# Patient Record
Sex: Male | Born: 1940
Health system: Southern US, Community
[De-identification: ages and names within clinical notes are randomized; demographics above are authoritative.]

## PROBLEM LIST (undated history)

## (undated) DIAGNOSIS — I10 Essential (primary) hypertension: Secondary | ICD-10-CM

---

## 2007-11-15 ENCOUNTER — Ambulatory Visit (HOSPITAL_COMMUNITY): Admission: RE | Admit: 2007-11-15 | Discharge: 2007-11-15 | Payer: Self-pay | Admitting: Pulmonary Disease

## 2008-12-02 ENCOUNTER — Encounter: Admission: RE | Admit: 2008-12-02 | Discharge: 2008-12-02 | Payer: Self-pay | Admitting: Family Medicine

## 2009-05-03 ENCOUNTER — Inpatient Hospital Stay (HOSPITAL_COMMUNITY): Admission: EM | Admit: 2009-05-03 | Discharge: 2009-05-05 | Payer: Self-pay | Admitting: Emergency Medicine

## 2009-05-04 ENCOUNTER — Ambulatory Visit: Payer: Self-pay | Admitting: Surgery

## 2009-05-04 ENCOUNTER — Encounter (INDEPENDENT_AMBULATORY_CARE_PROVIDER_SITE_OTHER): Payer: Self-pay | Admitting: Internal Medicine

## 2009-05-05 ENCOUNTER — Encounter (INDEPENDENT_AMBULATORY_CARE_PROVIDER_SITE_OTHER): Payer: Self-pay | Admitting: Internal Medicine

## 2010-02-09 ENCOUNTER — Encounter: Admission: RE | Admit: 2010-02-09 | Discharge: 2010-02-09 | Payer: Self-pay | Admitting: Family Medicine

## 2010-08-15 ENCOUNTER — Encounter: Payer: Self-pay | Admitting: Family Medicine

## 2010-10-28 LAB — RAPID URINE DRUG SCREEN, HOSP PERFORMED
Amphetamines: NOT DETECTED
Barbiturates: NOT DETECTED
Cocaine: NOT DETECTED
Tetrahydrocannabinol: NOT DETECTED

## 2010-10-28 LAB — DIFFERENTIAL
Basophils Absolute: 0 10*3/uL (ref 0.0–0.1)
Basophils Relative: 1 % (ref 0–1)
Eosinophils Absolute: 0.2 10*3/uL (ref 0.0–0.7)
Eosinophils Absolute: 0.3 10*3/uL (ref 0.0–0.7)
Eosinophils Relative: 4 % (ref 0–5)
Lymphs Abs: 3.1 10*3/uL (ref 0.7–4.0)
Monocytes Relative: 6 % (ref 3–12)
Monocytes Relative: 7 % (ref 3–12)
Neutrophils Relative %: 46 % (ref 43–77)

## 2010-10-28 LAB — MAGNESIUM: Magnesium: 2.4 mg/dL (ref 1.5–2.5)

## 2010-10-28 LAB — BASIC METABOLIC PANEL
BUN: 12 mg/dL (ref 6–23)
BUN: 29 mg/dL — ABNORMAL HIGH (ref 6–23)
BUN: 36 mg/dL — ABNORMAL HIGH (ref 6–23)
CO2: 25 mEq/L (ref 19–32)
CO2: 26 mEq/L (ref 19–32)
CO2: 27 mEq/L (ref 19–32)
Calcium: 8.8 mg/dL (ref 8.4–10.5)
Calcium: 9.8 mg/dL (ref 8.4–10.5)
Chloride: 102 mEq/L (ref 96–112)
Chloride: 84 mEq/L — ABNORMAL LOW (ref 96–112)
Chloride: 98 mEq/L (ref 96–112)
Creatinine, Ser: 0.99 mg/dL (ref 0.4–1.5)
Creatinine, Ser: 1.35 mg/dL (ref 0.4–1.5)
GFR calc non Af Amer: 38 mL/min — ABNORMAL LOW (ref >60)
GFR calc non Af Amer: 52 mL/min — ABNORMAL LOW (ref >60)
Glucose, Bld: 382 mg/dL — ABNORMAL HIGH (ref 70–99)
Glucose, Bld: 564 mg/dL (ref 70–99)
Potassium: 3.8 mEq/L (ref 3.5–5.1)
Potassium: 3.8 mEq/L (ref 3.5–5.1)
Potassium: 3.9 mEq/L (ref 3.5–5.1)
Sodium: 127 mEq/L — ABNORMAL LOW (ref 135–145)

## 2010-10-28 LAB — CBC
HCT: 45.1 % (ref 39.0–52.0)
Hemoglobin: 16.2 g/dL (ref 13.0–17.0)
MCHC: 33.5 g/dL (ref 30.0–36.0)
MCV: 90.2 fL (ref 78.0–100.0)
Platelets: 178 10*3/uL (ref 150–400)
Platelets: 190 10*3/uL (ref 150–400)
RBC: 4.99 MIL/uL (ref 4.22–5.81)
RBC: 5.28 MIL/uL (ref 4.22–5.81)

## 2010-10-28 LAB — GLUCOSE, CAPILLARY
Glucose-Capillary: 318 mg/dL — ABNORMAL HIGH (ref 70–99)
Glucose-Capillary: 351 mg/dL — ABNORMAL HIGH (ref 70–99)
Glucose-Capillary: 429 mg/dL — ABNORMAL HIGH (ref 70–99)
Glucose-Capillary: 482 mg/dL — ABNORMAL HIGH (ref 70–99)

## 2010-10-28 LAB — PHOSPHORUS
Phosphorus: 2.5 mg/dL (ref 2.3–4.6)
Phosphorus: 2.9 mg/dL (ref 2.3–4.6)

## 2010-10-28 LAB — LIPID PANEL
Cholesterol: 196 mg/dL (ref 0–200)
HDL: 39 mg/dL — ABNORMAL LOW (ref >39)
LDL Cholesterol: UNDETERMINED mg/dL (ref 0–99)
Total CHOL/HDL Ratio: 5 RATIO
Triglycerides: 652 mg/dL — ABNORMAL HIGH (ref <150)

## 2010-10-28 LAB — COMPREHENSIVE METABOLIC PANEL
ALT: 26 U/L (ref 0–53)
AST: 21 U/L (ref 0–37)
Calcium: 9.1 mg/dL (ref 8.4–10.5)
GFR calc Af Amer: 53 mL/min — ABNORMAL LOW (ref >60)
Glucose, Bld: 407 mg/dL — ABNORMAL HIGH (ref 70–99)
Sodium: 131 mEq/L — ABNORMAL LOW (ref 135–145)
Total Protein: 7.2 g/dL (ref 6.0–8.3)

## 2010-10-28 LAB — URINALYSIS, ROUTINE W REFLEX MICROSCOPIC
Bilirubin Urine: NEGATIVE
Ketones, ur: 15 mg/dL — AB
Protein, ur: 30 mg/dL — AB

## 2010-10-28 LAB — SODIUM, URINE, RANDOM: Sodium, Ur: 33 mEq/L

## 2010-10-28 LAB — NA AND K (SODIUM & POTASSIUM), RAND UR: Potassium Urine: 17 mEq/L

## 2010-10-28 LAB — URINE MICROSCOPIC-ADD ON

## 2010-10-28 LAB — HEMOGLOBIN A1C: Hgb A1c MFr Bld: 11.5 % — ABNORMAL HIGH (ref 4.6–6.1)

## 2010-10-28 LAB — CREATININE, URINE, RANDOM: Creatinine, Urine: 91 mg/dL

## 2010-10-28 LAB — OSMOLALITY: Osmolality: 292 mOsm/kg (ref 275–300)

## 2010-10-28 LAB — OSMOLALITY, URINE: Osmolality, Ur: 585 mOsm/kg (ref 390–1090)

## 2010-10-28 LAB — URINE CULTURE
Colony Count: NO GROWTH
Culture: NO GROWTH

## 2010-10-28 LAB — CALCIUM, URINE, RANDOM: Calcium, Ur: 1 mg/dL

## 2010-10-28 LAB — TSH: TSH: 1.972 u[IU]/mL (ref 0.350–4.500)

## 2016-10-31 ENCOUNTER — Ambulatory Visit (INDEPENDENT_AMBULATORY_CARE_PROVIDER_SITE_OTHER): Payer: 59

## 2016-10-31 ENCOUNTER — Ambulatory Visit (INDEPENDENT_AMBULATORY_CARE_PROVIDER_SITE_OTHER): Payer: 59 | Admitting: Orthopedic Surgery

## 2016-10-31 DIAGNOSIS — M25512 Pain in left shoulder: Secondary | ICD-10-CM | POA: Diagnosis not present

## 2016-11-01 ENCOUNTER — Encounter (INDEPENDENT_AMBULATORY_CARE_PROVIDER_SITE_OTHER): Payer: Self-pay | Admitting: Orthopedic Surgery

## 2016-11-01 DIAGNOSIS — M25512 Pain in left shoulder: Secondary | ICD-10-CM | POA: Diagnosis not present

## 2016-11-01 MED ORDER — BUPIVACAINE HCL 0.5 % IJ SOLN
9.0000 mL | INTRAMUSCULAR | Status: AC | PRN
  Administered 2016-11-01: 9 mL via INTRA_ARTICULAR

## 2016-11-01 MED ORDER — LIDOCAINE HCL 1 % IJ SOLN
5.0000 mL | INTRAMUSCULAR | Status: AC | PRN
  Administered 2016-11-01: 5 mL

## 2016-11-01 MED ORDER — METHYLPREDNISOLONE ACETATE 40 MG/ML IJ SUSP
40.0000 mg | INTRAMUSCULAR | Status: AC | PRN
  Administered 2016-11-01: 40 mg via INTRA_ARTICULAR

## 2016-11-01 NOTE — Progress Notes (Signed)
Office Visit Note   Patient: Lucas Lin           Date of Birth: 1941/01/27           MRN: [id number] Visit Date: 10/31/2016 Requested by: No referring provider defined for this encounter. PCP: Lupe Carney, MD  Subjective: Chief Complaint  Patient presents with  . Left Shoulder - Pain    HPI: [name] is a 76 year old patient with left shoulder pain.  He usually gets right shoulder injections for rotator cuff arthropathy but now he reports left shoulder pain.  He is right-hand-dominant.  He works at [employer].  Pain has been worse over the last 3 months.  He is been fairly severe for the last year.  He describes pain with laying down and swells with certain motions.  He does have difficulty with overhead motions as expected.  The pain does not wake him from sleep at night.              ROS: All systems reviewed are negative as they relate to the chief complaint within the history of present illness.  Patient denies  fevers or chills.   Assessment & Plan: Visit Diagnoses:  1. Left shoulder pain, unspecified chronicity     Plan: Impression is left shoulder rotator cuff arthropathy.  Plan is injection performed today.  He's going to consider reverse shoulder replacement we would need to assess the feasibility of that with a preoperative thin cut CT scan.  It looks like he does have significant glenoid erosion more so on the left than the right.  Follow-Up Instructions: No Follow-up on file.   Orders:  Orders Placed This Encounter  Procedures  . XR Shoulder Left   No orders of the defined types were placed in this encounter.     Procedures: Large Joint Inj Date/Time: 11/01/2016 12:03 PM Performed by: Cammy Copa Authorized by: Cammy Copa   Consent Given by:  Patient Site marked: the procedure site was marked   Timeout: prior to procedure the correct patient, procedure, and site was verified   Indications:  Pain and diagnostic  evaluation Location:  Shoulder Site:  L glenohumeral Prep: patient was prepped and draped in usual sterile fashion   Needle Size:  18 G Needle Length:  1.5 inches Approach:  Posterior Ultrasound Guidance: No   Fluoroscopic Guidance: No   Arthrogram: No   Medications:  5 mL lidocaine 1 %; 9 mL bupivacaine 0.5 %; 40 mg methylPREDNISolone acetate 40 MG/ML Aspiration Attempted: No   Patient tolerance:  Patient tolerated the procedure well with no immediate complications     Clinical Data: No additional findings.  Objective: Vital Signs: There were no vitals taken for this visit.  Physical Exam:   Constitutional: Patient appears well-developed HEENT:  Head: Normocephalic Eyes:EOM are normal Neck: Normal range of motion Cardiovascular: Normal rate Pulmonary/chest: Effort normal Neurologic: Patient is alert Skin: Skin is warm Psychiatric: Patient has normal mood and affect    Ortho ExaOr to exam demonstrates good cervical spine range of motion.  Patient has restricted voluntary forward flexion and abduction to less than 90 with both shoulders.  Course grinding and crepitus is present with internal/external rotation of the shoulder.  Deltoid does fire.  No other masses lymph adenopathy or skin changes noted in the shoulder girdle region.m:   Specialty Comments:  No specialty comments available.  Imaging: Xr Shoulder Left  Result Date: 11/01/2016 Left shoulder AP outlet and axillary reviewed.  Significant  glenohumeral rotator cuff arthropathy is present.  2 retained pins from prior acromioclavicular stabilization are noted.  They're unchanged in appearance from their prior x-rays.  Glenoid erosion is present.  Superior migration of the humeral head is present.    PMFS History: There are no active problems to display for this patient.  No past medical history on file.  No family history on file.  No past surgical history on file. Social History   Occupational History   . Not on file.   Social History Main Topics  . Smoking status: Not on file  . Smokeless tobacco: Not on file  . Alcohol use Not on file  . Drug use: Unknown  . Sexual activity: Not on file

## 2016-11-28 DIAGNOSIS — I10 Essential (primary) hypertension: Secondary | ICD-10-CM | POA: Diagnosis not present

## 2016-11-28 DIAGNOSIS — E7800 Pure hypercholesterolemia, unspecified: Secondary | ICD-10-CM | POA: Diagnosis not present

## 2016-11-28 DIAGNOSIS — E119 Type 2 diabetes mellitus without complications: Secondary | ICD-10-CM | POA: Diagnosis not present

## 2016-12-13 DIAGNOSIS — L02232 Carbuncle of back [any part, except buttock]: Secondary | ICD-10-CM | POA: Diagnosis not present

## 2016-12-26 DIAGNOSIS — H40013 Open angle with borderline findings, low risk, bilateral: Secondary | ICD-10-CM | POA: Diagnosis not present

## 2016-12-26 DIAGNOSIS — H2513 Age-related nuclear cataract, bilateral: Secondary | ICD-10-CM | POA: Diagnosis not present

## 2016-12-26 DIAGNOSIS — H01021 Squamous blepharitis right upper eyelid: Secondary | ICD-10-CM | POA: Diagnosis not present

## 2017-01-11 ENCOUNTER — Encounter (INDEPENDENT_AMBULATORY_CARE_PROVIDER_SITE_OTHER): Payer: Self-pay | Admitting: Orthopedic Surgery

## 2017-01-11 ENCOUNTER — Ambulatory Visit (INDEPENDENT_AMBULATORY_CARE_PROVIDER_SITE_OTHER): Payer: 59 | Admitting: Orthopedic Surgery

## 2017-01-11 DIAGNOSIS — M25512 Pain in left shoulder: Secondary | ICD-10-CM | POA: Diagnosis not present

## 2017-01-11 DIAGNOSIS — G8929 Other chronic pain: Secondary | ICD-10-CM

## 2017-01-11 NOTE — Progress Notes (Signed)
Office Visit Note   Patient: Lucas Lin           Date of Birth: 09/22/1940           MRN: [id number] Visit Date: 01/11/2017 Requested by: Clovis RileyMitchell, L.August Saucerean, MD [address] [place], [place] [address] PCP: Clovis RileyMitchell, L.August Saucerean, MD  Subjective: Chief Complaint  Patient presents with  . Left Shoulder - Follow-up    HPI: Lucas Lin is a patient with known bilateral rotator cuff arthropathy worse in the left shoulder.  Her for 2 month follow-up on the left shoulder.  Injection for 9 2018.  He states he is doing much better with his left shoulder.  He did get significant relief from the injection.  She still has some soreness but feels like he is still improving.  He is still working.  He is using both arms and shoulders.  He takes [facility] arthritis pain pills.  He is however having some more back issues.  He's had good result from epidural steroid injections in the past with Dr. Alvester MorinNewton.  Denies any red flag symptoms regarding the back.              ROS: All systems reviewed are negative as they relate to the chief complaint within the history of present illness.  Patient denies  fevers or chills.   Assessment & Plan: Visit Diagnoses:  1. Chronic left shoulder pain     Plan: Impression is bilateral shoulder rotator cuff arthropathy nonoperative on the left due to absence of sufficient glenoid bone.  He's doing recently well with these injections and we will continue these into the near future.  In regards to his back and like to send and Dr. Cyndie ChimeNguyen for consideration of further injection treatment.  I'll see him back as needed  Follow-Up Instructions: No Follow-up on file.   Orders:  Orders Placed This Encounter  Procedures  . Ambulatory referral to Physical Medicine Rehab   No orders of the defined types were placed in this encounter.     Procedures: No procedures performed   Clinical Data: No additional findings.  Objective: Vital Signs: There were no vitals taken  for this visit.  Physical Exam:   Constitutional: Patient appears well-developed HEENT:  Head: Normocephalic Eyes:EOM are normal Neck: Normal range of motion Cardiovascular: Normal rate Pulmonary/chest: Effort normal Neurologic: Patient is alert Skin: Skin is warm Psychiatric: Patient has normal mood and affect    Ortho Exam: Orthopedic exam demonstrates good cervical spine range of motion has coarseness and grinding with passive range of motion of both shoulders but it's worse on the left.  Does not have for flexor abduction above 90 and he does have rotator cuff weakness but the deltoid fires bilaterally.  Has some arthritis in his hands as well.  Radial pulses intact bilaterally.  No nerve retention signs in bilateral legs he'll pulses palpable no other masses lymph adenopathy or skin changes noted in the leg region.  He states he is listing a little bit to the right when he walks.  Specialty Comments:  No specialty comments available.  Imaging: No results found.   PMFS History: There are no active problems to display for this patient.  No past medical history on file.  No family history on file.  No past surgical history on file. Social History   Occupational History  . Not on file.   Social History Main Topics  . Smoking status: Former Games developermoker  . Smokeless tobacco: Never Used  .  Alcohol use Not on file  . Drug use: Unknown  . Sexual activity: Not on file

## 2017-01-31 ENCOUNTER — Ambulatory Visit (INDEPENDENT_AMBULATORY_CARE_PROVIDER_SITE_OTHER): Payer: Self-pay

## 2017-01-31 ENCOUNTER — Encounter (INDEPENDENT_AMBULATORY_CARE_PROVIDER_SITE_OTHER): Payer: Self-pay | Admitting: Physical Medicine and Rehabilitation

## 2017-01-31 ENCOUNTER — Ambulatory Visit (INDEPENDENT_AMBULATORY_CARE_PROVIDER_SITE_OTHER): Payer: 59 | Admitting: Physical Medicine and Rehabilitation

## 2017-01-31 VITALS — BP 138/71 | HR 67

## 2017-01-31 DIAGNOSIS — G8929 Other chronic pain: Secondary | ICD-10-CM | POA: Diagnosis not present

## 2017-01-31 DIAGNOSIS — M47816 Spondylosis without myelopathy or radiculopathy, lumbar region: Secondary | ICD-10-CM

## 2017-01-31 DIAGNOSIS — M545 Low back pain: Secondary | ICD-10-CM

## 2017-01-31 MED ORDER — METHYLPREDNISOLONE ACETATE 80 MG/ML IJ SUSP
80.0000 mg | Freq: Once | INTRAMUSCULAR | Status: AC
  Administered 2017-01-31: 80 mg

## 2017-01-31 MED ORDER — LIDOCAINE HCL (PF) 1 % IJ SOLN
2.0000 mL | Freq: Once | INTRAMUSCULAR | Status: AC
  Administered 2017-01-31: 2 mL

## 2017-01-31 NOTE — Patient Instructions (Signed)

## 2017-01-31 NOTE — Progress Notes (Deleted)
Patient states has a "nagging pain" in the middle of this low back. States back feels tired. No leg pain.

## 2017-02-02 NOTE — Procedures (Signed)
Lumbar Facet Joint Intra-Articular Injection(s) with Fluoroscopic Guidance  Patient: Lucas Lin      Date of Birth: 04/24/1941 MRN: [id number] PCP: Lucas Lin, L.Lucas Saucerean, MD      Visit Date: 01/31/2017   Lucas Lin is a 76 year old gentleman that I have seen on a few occasions and has completed epidural injections with really good relief of radicular hip and leg pain. He comes in today with slightly different complaints of midline low back pain at the lumbosacral junction. It's worse with standing and going from sit to stand. It is relieved at rest. He's had similar symptoms in the past but has had more radicular component. He's had no trauma or other problems just worsening low back pain. He states last injections helped his hip and leg significantly. He is followed by Dr. August Lin for his chronic shoulder pain as well and saw him recently. Imaging that we have has shown facet arthropathy and multilevel spurring at L5-S1. I think the best approach is diagnostic facet joint blocks at L5-S1. If that shows that it is facet mediated pain which I suspect that we could look at focused physical therapy followed by possibly double block paradigm medial branches and radiofrequency ablation. Physical exam shows patient relates without aid with good distal strength and pain with extension.  Universal Protocol:    Date/Time: 07/12/176:20 AM  Consent Given By: the patient  Position: PRONE   Additional Comments: Vital signs were monitored before and after the procedure. Patient was prepped and draped in the usual sterile fashion. The correct patient, procedure, and site was verified.   Injection Procedure Details:  Procedure Site One Meds Administered:  Meds ordered this encounter  Medications  . lidocaine (PF) (XYLOCAINE) 1 % injection 2 mL  . methylPREDNISolone acetate (DEPO-MEDROL) injection 80 mg     Laterality: Bilateral  Location/Site:  L5-S1  Needle size: 22 guage  Needle type:  Spinal  Needle Placement: Articular  Findings:  -Contrast Used: 0.5 mL iohexol 180 mg iodine/mL   -Comments: Excellent flow of contrast producing a partial arthrogram.  Procedure Details: The fluoroscope beam is vertically oriented in AP, and the inferior recess is visualized beneath the lower pole of the inferior apophyseal process, which represents the target point for needle insertion. When direct visualization is difficult the target point is located at the medial projection of the vertebral pedicle. The region overlying each aforementioned target is locally anesthetized with a 1 to 2 ml. volume of 1% Lidocaine without Epinephrine.   The spinal needle was inserted into each of the above mentioned facet joints using biplanar fluoroscopic guidance. A 0.25 to 0.5 ml. volume of Isovue-250 was injected and a partial facet joint arthrogram was obtained. A single spot film was obtained of the resulting arthrogram.    One to 1.25 ml of the steroid/anesthetic solution was then injected into each of the facet joints noted above.   Additional Comments:  The patient tolerated the procedure well Dressing: Band-Aid    Post-procedure details: Patient was observed during the procedure. Post-procedure instructions were reviewed.  Patient left the clinic in stable condition.

## 2017-05-04 ENCOUNTER — Telehealth (INDEPENDENT_AMBULATORY_CARE_PROVIDER_SITE_OTHER): Payer: Self-pay | Admitting: Physical Medicine and Rehabilitation

## 2017-05-04 NOTE — Telephone Encounter (Signed)
Patient said he got 60-70% relief for several weeks. Scheduled for 05/16/17 at 1500.

## 2017-05-04 NOTE — Telephone Encounter (Signed)
Ok only helped alot

## 2017-05-16 ENCOUNTER — Ambulatory Visit (INDEPENDENT_AMBULATORY_CARE_PROVIDER_SITE_OTHER): Payer: 59

## 2017-05-16 ENCOUNTER — Ambulatory Visit (INDEPENDENT_AMBULATORY_CARE_PROVIDER_SITE_OTHER): Payer: 59 | Admitting: Physical Medicine and Rehabilitation

## 2017-05-16 VITALS — BP 133/72 | HR 76 | Temp 97.5°F

## 2017-05-16 DIAGNOSIS — M47816 Spondylosis without myelopathy or radiculopathy, lumbar region: Secondary | ICD-10-CM

## 2017-05-16 DIAGNOSIS — G8929 Other chronic pain: Secondary | ICD-10-CM

## 2017-05-16 DIAGNOSIS — M545 Low back pain: Secondary | ICD-10-CM

## 2017-05-16 MED ORDER — METHYLPREDNISOLONE ACETATE 80 MG/ML IJ SUSP
80.0000 mg | Freq: Once | INTRAMUSCULAR | Status: AC
  Administered 2017-05-16: 80 mg

## 2017-05-16 MED ORDER — BUPIVACAINE HCL 0.5 % IJ SOLN
3.0000 mL | Freq: Once | INTRAMUSCULAR | Status: AC
  Administered 2017-05-16: 3 mL

## 2017-05-16 MED ORDER — LIDOCAINE HCL (PF) 1 % IJ SOLN
2.0000 mL | Freq: Once | INTRAMUSCULAR | Status: AC
  Administered 2017-05-16: 2 mL

## 2017-05-16 NOTE — Patient Instructions (Signed)

## 2017-05-16 NOTE — Progress Notes (Deleted)
Patient states he is having a "flare up". States the more he walks his back "draws up", like almost bad muscle spasms. Denies radiculopathy.

## 2017-05-22 ENCOUNTER — Telehealth (INDEPENDENT_AMBULATORY_CARE_PROVIDER_SITE_OTHER): Payer: Self-pay | Admitting: Physical Medicine and Rehabilitation

## 2017-05-22 NOTE — Telephone Encounter (Signed)
Called patient and advised him to call us when the pain is starting to come back so that we can refer him to physical therapy.

## 2017-05-22 NOTE — Telephone Encounter (Signed)
When he is starting to hurt we need to do PT

## 2017-05-26 NOTE — Procedures (Signed)
Mr. Lucas Lin is a very pleasant 76 year old who has axial low back pain chronic and worsening worsening with standing and extension.  He is failed conservative care including medication management modification and time.  This is been ongoing for quite a while.  Prior medial branch facet joint blocks were successful in relieving his pain actually lasted for quite a while.  And a repeat this today with a pain diary.  We would look at regrouping with physical therapy and then look at radiofrequency ablation.  The injection  will be diagnostic and hopefully therapeutic. The patient has failed conservative care including time, medications and activity modification.  Lumbar Diagnostic Facet Joint Nerve Block with Fluoroscopic Guidance   Patient: [name]      Date of Birth: 09/12/1940 MRN: [id number] PCP: Clovis RileyMitchell, L.August Saucerean, MD      Visit Date: 05/16/2017   Universal Protocol:    Date/Time: 11/02/185:31 AM  Consent Given By: the patient  Position: PRONE  Additional Comments: Vital signs were monitored before and after the procedure. Patient was prepped and draped in the usual sterile fashion. The correct patient, procedure, and site was verified.   Injection Procedure Details:  Procedure Site One Meds Administered:  Meds ordered this encounter  Medications  . lidocaine (PF) (XYLOCAINE) 1 % injection 2 mL  . methylPREDNISolone acetate (DEPO-MEDROL) injection 80 mg  . bupivacaine (MARCAINE) 0.5 % (with pres) injection 3 mL     Laterality: Bilateral  Location/Site:  L5-S1  Needle size: 22 G  Needle type: spinal needle  Needle Placement: Oblique pedical  Findings:  -Contrast Used: 0.5 mL iohexol 180 mg iodine/mL   -Comments: It was excellent flow of contrast along the articular pillars without intravascular flow.  Procedure Details: The fluoroscope beam is vertically oriented in AP and then obliqued 15 to 20 degrees to the ipsilateral side of the desired nerve to achieve  the "Scotty dog" appearance.  The skin over the target area of the junction of the superior articulating process and the transverse process (sacral ala if blocking the L5 dorsal rami) was locally anesthetized with a 1 ml volume of 1% Lidocaine without Epinephrine.  The spinal needle was inserted and advanced in a trajectory view down to the target.   After contact with periosteum and negative aspirate for blood and CSF, correct placement without intravascular or epidural spread was confirmed by injecting 0.5 ml. of Isovue-250.  A spot radiograph was obtained of this image.    Next, a 0.5 ml. volume of the injectate described above was injected. The needle was then redirected to the other facet joint nerves mentioned above if needed.  Prior to the procedure, the patient was given a Pain Diary which was completed for baseline measurements.  After the procedure, the patient rated their pain every 30 minutes and will continue rating at this frequency for a total of 5 hours.  The patient has been asked to complete the Diary and return to us by mail, fax or hand delivered as soon as possible.   Additional Comments:  The patient tolerated the procedure well Dressing: Band-Aid    Post-procedure details: Patient was observed during the procedure. Post-procedure instructions were reviewed.  Patient left the clinic in stable condition.

## 2017-06-01 DIAGNOSIS — E119 Type 2 diabetes mellitus without complications: Secondary | ICD-10-CM | POA: Diagnosis not present

## 2017-06-01 DIAGNOSIS — I10 Essential (primary) hypertension: Secondary | ICD-10-CM | POA: Diagnosis not present

## 2017-06-01 DIAGNOSIS — E7800 Pure hypercholesterolemia, unspecified: Secondary | ICD-10-CM | POA: Diagnosis not present

## 2017-10-23 DIAGNOSIS — R109 Unspecified abdominal pain: Secondary | ICD-10-CM | POA: Diagnosis not present

## 2017-11-28 DIAGNOSIS — H40013 Open angle with borderline findings, low risk, bilateral: Secondary | ICD-10-CM | POA: Diagnosis not present

## 2017-11-28 DIAGNOSIS — H2513 Age-related nuclear cataract, bilateral: Secondary | ICD-10-CM | POA: Diagnosis not present

## 2017-11-28 DIAGNOSIS — E119 Type 2 diabetes mellitus without complications: Secondary | ICD-10-CM | POA: Diagnosis not present

## 2017-11-30 DIAGNOSIS — E1121 Type 2 diabetes mellitus with diabetic nephropathy: Secondary | ICD-10-CM | POA: Diagnosis not present

## 2017-11-30 DIAGNOSIS — I10 Essential (primary) hypertension: Secondary | ICD-10-CM | POA: Diagnosis not present

## 2017-11-30 DIAGNOSIS — E7800 Pure hypercholesterolemia, unspecified: Secondary | ICD-10-CM | POA: Diagnosis not present

## 2018-03-01 DIAGNOSIS — E119 Type 2 diabetes mellitus without complications: Secondary | ICD-10-CM | POA: Diagnosis not present

## 2018-03-01 DIAGNOSIS — H2513 Age-related nuclear cataract, bilateral: Secondary | ICD-10-CM | POA: Diagnosis not present

## 2018-03-01 DIAGNOSIS — H40013 Open angle with borderline findings, low risk, bilateral: Secondary | ICD-10-CM | POA: Diagnosis not present

## 2018-04-17 DIAGNOSIS — H40013 Open angle with borderline findings, low risk, bilateral: Secondary | ICD-10-CM | POA: Diagnosis not present

## 2018-04-17 DIAGNOSIS — H2513 Age-related nuclear cataract, bilateral: Secondary | ICD-10-CM | POA: Diagnosis not present

## 2018-04-17 DIAGNOSIS — E119 Type 2 diabetes mellitus without complications: Secondary | ICD-10-CM | POA: Diagnosis not present

## 2018-06-05 DIAGNOSIS — Z23 Encounter for immunization: Secondary | ICD-10-CM | POA: Diagnosis not present

## 2018-06-05 DIAGNOSIS — E7800 Pure hypercholesterolemia, unspecified: Secondary | ICD-10-CM | POA: Diagnosis not present

## 2018-06-05 DIAGNOSIS — I10 Essential (primary) hypertension: Secondary | ICD-10-CM | POA: Diagnosis not present

## 2018-06-05 DIAGNOSIS — E1121 Type 2 diabetes mellitus with diabetic nephropathy: Secondary | ICD-10-CM | POA: Diagnosis not present

## 2019-05-24 ENCOUNTER — Emergency Department (HOSPITAL_COMMUNITY): Payer: No Typology Code available for payment source

## 2019-05-24 ENCOUNTER — Other Ambulatory Visit: Payer: Self-pay

## 2019-05-24 ENCOUNTER — Emergency Department (HOSPITAL_COMMUNITY)
Admission: EM | Admit: 2019-05-24 | Discharge: 2019-05-24 | Disposition: A | Discharge status: Home / Self Care | Payer: No Typology Code available for payment source | Attending: Emergency Medicine | Admitting: Emergency Medicine

## 2019-05-24 DIAGNOSIS — Z87891 Personal history of nicotine dependence: Secondary | ICD-10-CM | POA: Insufficient documentation

## 2019-05-24 DIAGNOSIS — W2210XA Striking against or struck by unspecified automobile airbag, initial encounter: Secondary | ICD-10-CM | POA: Insufficient documentation

## 2019-05-24 DIAGNOSIS — Y9389 Activity, other specified: Secondary | ICD-10-CM | POA: Diagnosis not present

## 2019-05-24 DIAGNOSIS — Y92410 Unspecified street and highway as the place of occurrence of the external cause: Secondary | ICD-10-CM | POA: Diagnosis not present

## 2019-05-24 DIAGNOSIS — Y998 Other external cause status: Secondary | ICD-10-CM | POA: Diagnosis not present

## 2019-05-24 DIAGNOSIS — S14109A Unspecified injury at unspecified level of cervical spinal cord, initial encounter: Secondary | ICD-10-CM | POA: Diagnosis present

## 2019-05-24 DIAGNOSIS — Z79899 Other long term (current) drug therapy: Secondary | ICD-10-CM | POA: Diagnosis not present

## 2019-05-24 DIAGNOSIS — S12601A Unspecified nondisplaced fracture of seventh cervical vertebra, initial encounter for closed fracture: Secondary | ICD-10-CM | POA: Diagnosis not present

## 2019-05-24 MED ORDER — OXYCODONE-ACETAMINOPHEN 5-325 MG PO TABS: 1.0000 | ORAL_TABLET | ORAL | 0 refills | Status: DC | PRN
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
## 2019-05-24 NOTE — Discharge Instructions (Addendum)
Please wear the Aspen collar at all times as instructed.  May remove to wash and change out the pads.  Recommend calling the neurosurgery office Monday morning to schedule a follow-up appointment.  If you develop numbness, weakness, difficulty breathing, chest pain, abdominal pain or other new concerning symptoms please return to ER for reassessment.  Recommend taking the pain medicine as prescribed, please note it can make you drowsy and should not be taken while operating machinery or driving.
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
## 2019-09-26 ENCOUNTER — Ambulatory Visit: Payer: 59 | Attending: Internal Medicine

## 2019-09-26 DIAGNOSIS — Z23 Encounter for immunization: Secondary | ICD-10-CM | POA: Insufficient documentation

## 2019-09-26 NOTE — Progress Notes (Signed)
   Covid-19 Vaccination Clinic  Name:  Lucas Lin    MRN: [id number] DOB: 1941/03/05  09/26/2019  Mr. Lucas Lin was observed post Covid-19 immunization for 15 minutes without incident. He was provided with Vaccine Information Sheet and instruction to access the V-Safe system.   Mr. Lucas Lin was instructed to call 911 with any severe reactions post vaccine: Marland Kitchen Difficulty breathing  . Swelling of face and throat  . A fast heartbeat  . A bad rash all over body  . Dizziness and weakness   Immunizations Administered    Name Date Dose VIS Date Route   Pfizer COVID-19 Vaccine 09/26/2019 12:43 PM 0.3 mL 07/05/2019 Intramuscular   Manufacturer: [employer], [employer]   Lot: [id number]   NDC: [address]-4

## 2019-10-23 ENCOUNTER — Ambulatory Visit: Payer: 59 | Attending: Internal Medicine

## 2019-10-23 DIAGNOSIS — Z23 Encounter for immunization: Secondary | ICD-10-CM

## 2019-10-23 NOTE — Progress Notes (Signed)
   Covid-19 Vaccination Clinic  Name:  Lucas Lin    MRN: [id number] DOB: [birth year]  10/23/2019  Lucas Lin was observed post Covid-19 immunization for 15 minutes without incident. He was provided with Vaccine Information Sheet and instruction to access the V-Safe system.   Lucas Lin was instructed to call 911 with any severe reactions post vaccine: Marland Kitchen Difficulty breathing  . Swelling of face and throat  . A fast heartbeat  . A bad rash all over body  . Dizziness and weakness   Immunizations Administered    Name Date Dose VIS Date Route   Pfizer COVID-19 Vaccine 10/23/2019 12:11 PM 0.3 mL 07/05/2019 Intramuscular   Manufacturer: [employer], [employer]   Lot: [id number]   NDC: [address]-4

## 2020-07-08 ENCOUNTER — Ambulatory Visit: Payer: No Typology Code available for payment source | Attending: Internal Medicine

## 2020-07-08 ENCOUNTER — Other Ambulatory Visit (HOSPITAL_COMMUNITY): Payer: Self-pay | Admitting: Internal Medicine

## 2020-07-08 DIAGNOSIS — Z23 Encounter for immunization: Secondary | ICD-10-CM

## 2020-07-08 NOTE — Progress Notes (Signed)
   Covid-19 Vaccination Clinic  Name:  Lucas Lin    MRN: [id number] DOB: [birth year]  07/08/2020  [name] was observed post Covid-19 immunization for 15 minutes without incident. He was provided with Vaccine Information Sheet and instruction to access the V-Safe system.   [name] was instructed to call 911 with any severe reactions post vaccine: Marland Kitchen Difficulty breathing  . Swelling of face and throat  . A fast heartbeat  . A bad rash all over body  . Dizziness and weakness   Immunizations Administered    Name Date Dose VIS Date Route   Pfizer COVID-19 Vaccine 07/08/2020  9:21 AM 0.3 mL 05/13/2020 Intramuscular   Manufacturer: [employer], [employer]   Lot: [id number]   NDC: [address]-2
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
## 2022-11-26 ENCOUNTER — Emergency Department (HOSPITAL_COMMUNITY): Payer: 59

## 2022-11-26 ENCOUNTER — Inpatient Hospital Stay (HOSPITAL_COMMUNITY)
Admission: EM | Admit: 2022-11-26 | Discharge: 2022-12-01 | DRG: 100 | Disposition: A | Discharge status: Home / Self Care | Payer: 59 | Attending: Internal Medicine | Admitting: Internal Medicine

## 2022-11-26 DIAGNOSIS — G40901 Epilepsy, unspecified, not intractable, with status epilepticus: Secondary | ICD-10-CM | POA: Diagnosis present

## 2022-11-26 DIAGNOSIS — N179 Acute kidney failure, unspecified: Secondary | ICD-10-CM | POA: Diagnosis not present

## 2022-11-26 DIAGNOSIS — R471 Dysarthria and anarthria: Secondary | ICD-10-CM | POA: Diagnosis present

## 2022-11-26 DIAGNOSIS — E860 Dehydration: Secondary | ICD-10-CM | POA: Diagnosis present

## 2022-11-26 DIAGNOSIS — G9341 Metabolic encephalopathy: Secondary | ICD-10-CM | POA: Diagnosis present

## 2022-11-26 DIAGNOSIS — E1110 Type 2 diabetes mellitus with ketoacidosis without coma: Secondary | ICD-10-CM | POA: Diagnosis present

## 2022-11-26 DIAGNOSIS — I10 Essential (primary) hypertension: Secondary | ICD-10-CM | POA: Diagnosis present

## 2022-11-26 DIAGNOSIS — M159 Polyosteoarthritis, unspecified: Secondary | ICD-10-CM | POA: Diagnosis present

## 2022-11-26 DIAGNOSIS — E1100 Type 2 diabetes mellitus with hyperosmolarity without nonketotic hyperglycemic-hyperosmolar coma (NKHHC): Secondary | ICD-10-CM | POA: Diagnosis present

## 2022-11-26 DIAGNOSIS — Z888 Allergy status to other drugs, medicaments and biological substances status: Secondary | ICD-10-CM

## 2022-11-26 DIAGNOSIS — I7389 Other specified peripheral vascular diseases: Secondary | ICD-10-CM | POA: Diagnosis not present

## 2022-11-26 DIAGNOSIS — E785 Hyperlipidemia, unspecified: Secondary | ICD-10-CM | POA: Diagnosis present

## 2022-11-26 DIAGNOSIS — M109 Gout, unspecified: Secondary | ICD-10-CM | POA: Diagnosis present

## 2022-11-26 DIAGNOSIS — E861 Hypovolemia: Secondary | ICD-10-CM | POA: Diagnosis present

## 2022-11-26 DIAGNOSIS — Z794 Long term (current) use of insulin: Secondary | ICD-10-CM | POA: Diagnosis not present

## 2022-11-26 DIAGNOSIS — Z87891 Personal history of nicotine dependence: Secondary | ICD-10-CM

## 2022-11-26 DIAGNOSIS — E875 Hyperkalemia: Secondary | ICD-10-CM | POA: Diagnosis present

## 2022-11-26 DIAGNOSIS — M25472 Effusion, left ankle: Secondary | ICD-10-CM | POA: Diagnosis present

## 2022-11-26 DIAGNOSIS — G939 Disorder of brain, unspecified: Secondary | ICD-10-CM | POA: Diagnosis present

## 2022-11-26 DIAGNOSIS — M25511 Pain in right shoulder: Secondary | ICD-10-CM | POA: Diagnosis present

## 2022-11-26 DIAGNOSIS — M25772 Osteophyte, left ankle: Secondary | ICD-10-CM | POA: Diagnosis present

## 2022-11-26 HISTORY — DX: Essential (primary) hypertension: I10

## 2022-11-26 LAB — I-STAT VENOUS BLOOD GAS, ED
Acid-base deficit: 6 mmol/L — ABNORMAL HIGH (ref 0.0–2.0)
Bicarbonate: 19.7 mmol/L — ABNORMAL LOW (ref 20.0–28.0)
Calcium, Ion: 1.07 mmol/L — ABNORMAL LOW (ref 1.15–1.40)
HCT: 44 % (ref 39.0–52.0)
Hemoglobin: 15 g/dL (ref 13.0–17.0)
O2 Saturation: 98 %
Potassium: 5.8 mmol/L — ABNORMAL HIGH (ref 3.5–5.1)
Sodium: 120 mmol/L — ABNORMAL LOW (ref 135–145)
TCO2: 21 mmol/L — ABNORMAL LOW (ref 22–32)
pCO2, Ven: 39.4 mmHg — ABNORMAL LOW (ref 44–60)
pH, Ven: 7.308 (ref 7.25–7.43)
pO2, Ven: 115 mmHg — ABNORMAL HIGH (ref 32–45)

## 2022-11-26 LAB — I-STAT CHEM 8, ED
BUN: 76 mg/dL — ABNORMAL HIGH (ref 8–23)
Calcium, Ion: 1.07 mmol/L — ABNORMAL LOW (ref 1.15–1.40)
Chloride: 90 mmol/L — ABNORMAL LOW (ref 98–111)
Creatinine, Ser: 4.5 mg/dL — ABNORMAL HIGH (ref 0.61–1.24)
Glucose, Bld: >700 mg/dL (ref 70–99)
HCT: 43 % (ref 39.0–52.0)
Hemoglobin: 14.6 g/dL (ref 13.0–17.0)
Potassium: 5.8 mmol/L — ABNORMAL HIGH (ref 3.5–5.1)
Sodium: 121 mmol/L — ABNORMAL LOW (ref 135–145)
TCO2: 20 mmol/L — ABNORMAL LOW (ref 22–32)

## 2022-11-26 LAB — COMPREHENSIVE METABOLIC PANEL
ALT: 20 U/L (ref 0–44)
AST: 29 U/L (ref 15–41)
Albumin: 3.9 g/dL (ref 3.5–5.0)
Alkaline Phosphatase: 131 U/L — ABNORMAL HIGH (ref 38–126)
Anion gap: 20 — ABNORMAL HIGH (ref 5–15)
BUN: 79 mg/dL — ABNORMAL HIGH (ref 8–23)
CO2: 17 mmol/L — ABNORMAL LOW (ref 22–32)
Calcium: 9.3 mg/dL (ref 8.9–10.3)
Chloride: 82 mmol/L — ABNORMAL LOW (ref 98–111)
Creatinine, Ser: 4.24 mg/dL — ABNORMAL HIGH (ref 0.61–1.24)
GFR, Estimated: 13 mL/min — ABNORMAL LOW (ref >60)
Glucose, Bld: 1100 mg/dL (ref 70–99)
Potassium: 5.7 mmol/L — ABNORMAL HIGH (ref 3.5–5.1)
Sodium: 119 mmol/L — CL (ref 135–145)
Total Bilirubin: 1 mg/dL (ref 0.3–1.2)
Total Protein: 7.9 g/dL (ref 6.5–8.1)

## 2022-11-26 LAB — BASIC METABOLIC PANEL
Anion gap: 18 — ABNORMAL HIGH (ref 5–15)
BUN: 83 mg/dL — ABNORMAL HIGH (ref 8–23)
CO2: 17 mmol/L — ABNORMAL LOW (ref 22–32)
Calcium: 9 mg/dL (ref 8.9–10.3)
Chloride: 86 mmol/L — ABNORMAL LOW (ref 98–111)
Creatinine, Ser: 3.86 mg/dL — ABNORMAL HIGH (ref 0.61–1.24)
GFR, Estimated: 15 mL/min — ABNORMAL LOW (ref >60)
Glucose, Bld: 1036 mg/dL (ref 70–99)
Potassium: 5.4 mmol/L — ABNORMAL HIGH (ref 3.5–5.1)
Sodium: 121 mmol/L — ABNORMAL LOW (ref 135–145)

## 2022-11-26 LAB — BETA-HYDROXYBUTYRIC ACID: Beta-Hydroxybutyric Acid: 1.23 mmol/L — ABNORMAL HIGH (ref 0.05–0.27)

## 2022-11-26 LAB — URINALYSIS, ROUTINE W REFLEX MICROSCOPIC
Bacteria, UA: NONE SEEN
Bilirubin Urine: NEGATIVE
Glucose, UA: >=500 mg/dL — AB
Ketones, ur: NEGATIVE mg/dL
Leukocytes,Ua: NEGATIVE
Nitrite: NEGATIVE
Protein, ur: NEGATIVE mg/dL
Specific Gravity, Urine: 1.016 (ref 1.005–1.030)
pH: 5 (ref 5.0–8.0)

## 2022-11-26 LAB — RAPID URINE DRUG SCREEN, HOSP PERFORMED
Amphetamines: NOT DETECTED
Barbiturates: NOT DETECTED
Benzodiazepines: POSITIVE — AB
Cocaine: NOT DETECTED
Opiates: NOT DETECTED
Tetrahydrocannabinol: NOT DETECTED

## 2022-11-26 LAB — APTT: aPTT: 23 seconds — ABNORMAL LOW (ref 24–36)

## 2022-11-26 LAB — CBG MONITORING, ED
Glucose-Capillary: >600 mg/dL (ref 70–99)
Glucose-Capillary: >600 mg/dL (ref 70–99)
Glucose-Capillary: >600 mg/dL (ref 70–99)
Glucose-Capillary: >600 mg/dL (ref 70–99)
Glucose-Capillary: >600 mg/dL (ref 70–99)
Glucose-Capillary: >600 mg/dL (ref 70–99)

## 2022-11-26 LAB — CBC WITH DIFFERENTIAL/PLATELET
Abs Immature Granulocytes: 0.03 10*3/uL (ref 0.00–0.07)
Basophils Absolute: 0.1 10*3/uL (ref 0.0–0.1)
Basophils Relative: 1 %
Eosinophils Absolute: 0 10*3/uL (ref 0.0–0.5)
Eosinophils Relative: 0 %
HCT: 39.7 % (ref 39.0–52.0)
Hemoglobin: 13.7 g/dL (ref 13.0–17.0)
Immature Granulocytes: 0 %
Lymphocytes Relative: 9 %
Lymphs Abs: 0.7 10*3/uL (ref 0.7–4.0)
MCH: 29.1 pg (ref 26.0–34.0)
MCHC: 34.5 g/dL (ref 30.0–36.0)
MCV: 84.3 fL (ref 80.0–100.0)
Monocytes Absolute: 0.4 10*3/uL (ref 0.1–1.0)
Monocytes Relative: 4 %
Neutro Abs: 7.4 10*3/uL (ref 1.7–7.7)
Neutrophils Relative %: 86 %
Platelets: 226 10*3/uL (ref 150–400)
RBC: 4.71 MIL/uL (ref 4.22–5.81)
RDW: 13.9 % (ref 11.5–15.5)
WBC: 8.5 10*3/uL (ref 4.0–10.5)
nRBC: 0 % (ref 0.0–0.2)

## 2022-11-26 LAB — LIPASE, BLOOD: Lipase: 78 U/L — ABNORMAL HIGH (ref 11–51)

## 2022-11-26 LAB — PROTIME-INR
INR: 1.2 (ref 0.8–1.2)
Prothrombin Time: 14.9 seconds (ref 11.4–15.2)

## 2022-11-26 LAB — ETHANOL: Alcohol, Ethyl (B): <10 mg/dL (ref <10)

## 2022-11-26 LAB — GLUCOSE, CAPILLARY
Glucose-Capillary: >600 mg/dL (ref 70–99)
Glucose-Capillary: >600 mg/dL (ref 70–99)

## 2022-11-26 LAB — CK: Total CK: 619 U/L — ABNORMAL HIGH (ref 49–397)

## 2022-11-26 LAB — LACTIC ACID, PLASMA
Lactic Acid, Venous: 3.9 mmol/L (ref 0.5–1.9)
Lactic Acid, Venous: 2.3 mmol/L (ref 0.5–1.9)

## 2022-11-26 LAB — OSMOLALITY: Osmolality: 347 mOsm/kg (ref 275–295)

## 2022-11-26 LAB — MAGNESIUM: Magnesium: 2.7 mg/dL — ABNORMAL HIGH (ref 1.7–2.4)

## 2022-11-26 MED ORDER — HEPARIN SODIUM (PORCINE) 5000 UNIT/ML IJ SOLN
5000.0000 [IU] | Freq: Three times a day (TID) | INTRAMUSCULAR | Status: DC
  Administered 2022-11-27 – 2022-12-01 (×13): 5000 [IU] via SUBCUTANEOUS
  Filled 2022-11-26 (×13): qty 1

## 2022-11-26 MED ORDER — LEVETIRACETAM IN NACL 1000 MG/100ML IV SOLN
1000.0000 mg | Freq: Once | INTRAVENOUS | Status: AC
  Administered 2022-11-26: 1000 mg via INTRAVENOUS

## 2022-11-26 MED ORDER — LORAZEPAM 2 MG/ML IJ SOLN
2.0000 mg | Freq: Once | INTRAMUSCULAR | Status: AC
  Administered 2022-11-26: 2 mg via INTRAVENOUS

## 2022-11-26 MED ORDER — INSULIN REGULAR(HUMAN) IN NACL 100-0.9 UT/100ML-% IV SOLN
INTRAVENOUS | Status: DC
  Administered 2022-11-27: 11 [IU]/h via INTRAVENOUS
  Filled 2022-11-26: qty 100

## 2022-11-26 MED ORDER — INSULIN REGULAR(HUMAN) IN NACL 100-0.9 UT/100ML-% IV SOLN
INTRAVENOUS | Status: DC
  Administered 2022-11-26: 11 [IU]/h via INTRAVENOUS
  Filled 2022-11-26: qty 100

## 2022-11-26 MED ORDER — DEXTROSE 50 % IV SOLN: 0.0000 mL | INTRAVENOUS | Status: DC | PRN

## 2022-11-26 MED ORDER — ACETAMINOPHEN 325 MG PO TABS
650.0000 mg | ORAL_TABLET | Freq: Four times a day (QID) | ORAL | Status: DC | PRN
  Administered 2022-11-28 – 2022-11-29 (×3): 650 mg via ORAL
  Filled 2022-11-26 (×4): qty 2

## 2022-11-26 MED ORDER — LEVETIRACETAM IN NACL 500 MG/100ML IV SOLN: 500.0000 mg | INTRAVENOUS | Status: DC

## 2022-11-26 MED ORDER — DEXTROSE IN LACTATED RINGERS 5 % IV SOLN: INTRAVENOUS | Status: DC

## 2022-11-26 MED ORDER — LEVETIRACETAM 500 MG PO TABS
500.0000 mg | ORAL_TABLET | ORAL | Status: DC
  Administered 2022-11-27: 500 mg via ORAL
  Filled 2022-11-26: qty 1

## 2022-11-26 MED ORDER — LACTATED RINGERS IV SOLN: INTRAVENOUS | Status: DC

## 2022-11-26 MED ORDER — ACETAMINOPHEN 650 MG RE SUPP: 650.0000 mg | RECTAL | Status: DC | PRN

## 2022-11-26 MED ORDER — LACTATED RINGERS IV BOLUS
20.0000 mL/kg | Freq: Once | INTRAVENOUS | Status: AC
  Administered 2022-11-26: 1700 mL via INTRAVENOUS

## 2022-11-26 MED ORDER — LORAZEPAM 2 MG/ML IJ SOLN
INTRAMUSCULAR | Status: AC
  Filled 2022-11-26: qty 1

## 2022-11-26 NOTE — H&P (Addendum)
NAME:  Lucas Lin, MRN:  [id number], DOB:  02/23/1941, LOS: 0 ADMISSION DATE:  11/26/2022, CONSULTATION DATE:  5/4 REFERRING MD:  Dr. Jearld Fenton, CHIEF COMPLAINT:  seizure   History of Present Illness:  Patient is a 82 yo M w/ pertinent PMH DMT2, htn, hld presents to [facility] on 5/4 w/ seizures.   Patient has history of DMT2 but not on any medications. Per son states he was on insulin but quit taking about a year ago because sugars have improved. In 2010 patient was admitted w/ HHS w/ seizure activity. Patient was non compliant w/ home diabetes medications. Patient had RUE twitching.   On 5/4, patient was found down having tonic clonic seizure activity. LKN 2 hour before being found down. No hx of seizures. Patient has been complaining fo RUE twitching over the last few days that improves w/ rest. Upon EMS arrival, patient minimally interactive and had vomited. Patient given versed and transferred to [facility] ED. Upon arrival patient hemodynamically stable and afebrile. Loaded w/ keppra. Neuro consulted. CT head no acute abnormality. Taken to MRI and given ativan; results pending. EEG pending.  Na 119 and glucose 1,100. AG 20, co2 17. VBG 7.30, 39, 115, 19. UA without ketones. Patient started on insulin drip and given iv fluids. PCCM consulted for icu admission.  Pertinent ED labs: creat 4.24, LA 3.9, ck 619   Pertinent  Medical History  HTN HLD DMT2  Significant Hospital Events: Including procedures, antibiotic start and stop dates in addition to other pertinent events   5/4 seizure activity; EEG pending; HHS started on insulin and given iv fluids  Interim History / Subjective:  See above  Objective   Blood pressure 134/66, pulse 73, temperature (!) 97.3 F (36.3 C), temperature source Rectal, resp. rate (!) 21, height 6' (1.829 m), weight 85 kg, SpO2 100 %.        Intake/Output Summary (Last 24 hours) at 11/26/2022 2056 Last data filed at 11/26/2022 1903 Gross per 24 hour  Intake 100 ml   Output --  Net 100 ml   Filed Weights   11/26/22 1838  Weight: 85 kg    Examination: General:  ill appearing elderly male  HEENT: MM pink/dry; [place] in place Neuro: sedate after receiving versed and ativan; moves all extremities spontaneously; does not respond to voice but withdraws to pain; pupils 2 mm b/l sluggish CV: s1s2, RRR, no m/r/g PULM:  dim clear BS bilaterally; [place]; snoring respirations GI: soft, bsx4 active  Extremities: warm/dry, no edema  Skin: no rashes or lesions appreciated   Resolved Hospital Problem list     Assessment & Plan:  Seizure Acute encephalopathy: likely post ictal vs. HHS -CT head and MRI no acute abnormality -no recorded hx of alcohol abuse P: -EEG pending -neuro following; appreciate recs -loaded w/ keppra; further AEDs per neuro -prn ativan for seizure activity -seizure precautions in place -limit sedating meds -trend ck/la; iv fluid -UDS, ethanol pending -neuro protective measures -aspiration precautions; high risk for intubation w/ poor mental status  HHS Hx of DMT2: per son was on insulin before but taken off a year ago because he improved w/ diet change/exercise P: -Insulin per endotool -CBG monitoring -Aggressive IV fluid resuscitation -f/u osmolality and B-hydroxy -serial bmp -check A1c -check lipase  AKI Hyponatremia: 119 but correct to 133 given severe hyperglycemia P: -IV fluids -Trend BMP / urinary output -Replace electrolytes as indicated -Avoid nephrotoxic agents, ensure adequate renal perfusion  HTN HLD P: -hold home anti-hypertensive's/statin while  npo   Best Practice (right click and "Reselect all SmartList Selections" daily)   Diet/type: NPO DVT prophylaxis: prophylactic heparin  GI prophylaxis: N/A Lines: N/A Foley:  N/A Code Status:  full code Last date of multidisciplinary goals of care discussion [5/4 spoke w/ son at bedside; states no living will at home. Would want intubation and cpr if  needed.]  Labs   CBC: Recent Labs  Lab 11/26/22 1828 11/26/22 1841 11/26/22 1842  WBC 8.5  --   --   NEUTROABS 7.4  --   --   HGB 13.7 14.6 15.0  HCT 39.7 43.0 44.0  MCV 84.3  --   --   PLT 226  --   --     Basic Metabolic Panel: Recent Labs  Lab 11/26/22 1828 11/26/22 1841 11/26/22 1842  NA 119* 121* 120*  K 5.7* 5.8* 5.8*  CL 82* 90*  --   CO2 17*  --   --   GLUCOSE 1,100* >700*  --   BUN 79* 76*  --   CREATININE 4.24* 4.50*  --   CALCIUM 9.3  --   --   MG 2.7*  --   --    GFR: Estimated Creatinine Clearance: 13.9 mL/min (A) (by C-G formula based on SCr of 4.5 mg/dL (H)). Recent Labs  Lab 11/26/22 1828  WBC 8.5  LATICACIDVEN 3.9*    Liver Function Tests: Recent Labs  Lab 11/26/22 1828  AST 29  ALT 20  ALKPHOS 131*  BILITOT 1.0  PROT 7.9  ALBUMIN 3.9   No results for input(s): "LIPASE", "AMYLASE" in the last 168 hours. No results for input(s): "AMMONIA" in the last 168 hours.  ABG    Component Value Date/Time   HCO3 19.7 (L) 11/26/2022 1842   TCO2 21 (L) 11/26/2022 1842   ACIDBASEDEF 6.0 (H) 11/26/2022 1842   O2SAT 98 11/26/2022 1842     Coagulation Profile: Recent Labs  Lab 11/26/22 1828 11/26/22 1936  INR 1.1 1.2    Cardiac Enzymes: Recent Labs  Lab 11/26/22 1828  CKTOTAL 619*    HbA1C: Hgb A1c MFr Bld  Date/Time Value Ref Range Status  05/03/2009 03:30 PM (H) 4.6 - 6.1 % Final   11.5 (NOTE) The ADA recommends the following therapeutic goal for glycemic control related to Hgb A1c measurement: Goal of therapy: <6.5 Hgb A1c  Reference: American Diabetes Association: Clinical Practice Recommendations 2010, Diabetes Care, 2010, 33: (Suppl  1).    CBG: Recent Labs  Lab 11/26/22 1829 11/26/22 1934  GLUCAP >600* >600*    Review of Systems:   Patient is encephalopathic; therefore, history has been obtained from chart review.    Past Medical History:  He,  has no past medical history on file.   Surgical History:     Social History:   reports that he has quit smoking. He has never used smokeless tobacco.   Family History:  His family history is not on file.   Allergies Allergies  Allergen Reactions   Doxazosin     Other reaction(s): Cough (ALLERGY/intolerance)     Home Medications  Prior to Admission medications   Medication Sig Start Date End Date Taking? Authorizing Provider  amLODipine (NORVASC) 10 MG tablet Take 10 mg by mouth daily. 09/21/16   [provider]  hydrochlorothiazide (HYDRODIURIL) 25 MG tablet Take 25 mg by mouth daily. 09/21/16   [provider]  losartan (COZAAR) 50 MG tablet Take 50 mg by mouth daily. 09/21/16   [provider]  oxyCODONE-acetaminophen (PERCOCET) 5-325 MG tablet Take 1 tablet by mouth every 4 (four) hours as needed for severe pain. 05/24/19   Milagros Loll, MD  pravastatin (PRAVACHOL) 40 MG tablet Take 40 mg by mouth daily. 09/21/16   [provider]     Critical care time: 45 minutes    JD Daryel [facility] 11/26/2022, 8:56 PM  Please see [email or web address] for pager details.  From 7A-7P if no response, please call [phone]. After hours, please call ELink [phone].

## 2022-11-26 NOTE — ED Provider Notes (Signed)
Lucas Lin Provider Note   CSN: [id number] Arrival date & time: 11/26/22  1813     History  Chief Complaint  Patient presents with  . Seizures    Coming from home with seizures. Still actively seizing with lip smacking type movement.     Lucas Lin is a 82 y.o. male with h/o T2DM not on insulin, HTN, HLD who presents with seizures.   Pt found by family after an hour of not seeing him, seizing with tonic clonic movement. No history of seizures, or trauma. He is a diabetic with a bgl reading HIGH. EMS gave total of 10 mg IM versed and another 5 mg total of IV versed with patient arriving still actively seizing.  Further history could not be obtained d/t acuity of situation, patient unresponsive.  HPI     Home Medications Prior to Admission medications   Medication Sig Start Date End Date Taking? Authorizing Provider  amLODipine (NORVASC) 10 MG tablet Take 10 mg by mouth daily. 09/21/16   [provider]  hydrochlorothiazide (HYDRODIURIL) 25 MG tablet Take 25 mg by mouth daily. 09/21/16   [provider]  losartan (COZAAR) 50 MG tablet Take 50 mg by mouth daily. 09/21/16   [provider]  oxyCODONE-acetaminophen (PERCOCET) 5-325 MG tablet Take 1 tablet by mouth every 4 (four) hours as needed for severe pain. 05/24/19   Milagros Loll, MD  pravastatin (PRAVACHOL) 40 MG tablet Take 40 mg by mouth daily. 09/21/16   [provider]      Allergies    Doxazosin    Review of Systems   Review of Systems  Unable to perform ROS: Patient unresponsive   Physical Exam Updated Vital Signs BP 135/68 (BP Location: Right Arm)   Pulse 80   Temp 98.5 F (36.9 C) (Oral)   Resp 17   Ht 6' (1.829 m)   Wt 85 kg   SpO2 96%   BMI 25.41 kg/m  Physical Exam General: male lying in bed, unresponsive HEENT: PERRLA midrange, Sclera anicteric, trachea midline. Vomitus on the front of his shirt Cardiology:  RRR, no murmurs/rubs/gallops. BL radial and DP pulses equal bilaterally.  Resp: Normal respiratory rate and effort. CTAB, no wheezes, rhonchi, crackles.  Abd: Soft, non-distended. No rebound tenderness or guarding.  GU: Deferred. MSK: No peripheral edema or signs of trauma. Extremities without deformity.  Skin: warm, dry. No rashes or lesions. Neuro: Unresponsive to verbal stimuli. Doesn't follow commands Briefly opens eyes to noxious stimuli.  Withdraws all extremities to pain, cranial nerves grossly intact.   ED Results / Procedures / Treatments   Labs (all labs ordered are listed, but only abnormal results are displayed) Labs Reviewed - No data to display  EKG EKG Interpretation  Date/Time:  Saturday Nov 26 2022 18:21:06 EDT Ventricular Rate:  87 PR Interval:  208 QRS Duration: 95 QT Interval:  372 QTC Calculation: 448 R Axis:   21 Text Interpretation: Sinus rhythm Nonspecific T wave abnormality Confirmed by Cathren Laine ([id number]) on 11/27/2022 7:57:53 AM  Radiology CTH: 1. No acute intracranial hemorrhage or infarct. Stable senescent change.  Procedures .Critical Care  Performed by: Loetta Rough, MD Authorized by: Loetta Rough, MD   Critical care provider statement:    Critical care time (minutes):  45   Critical care was necessary to treat or prevent imminent or life-threatening deterioration of the following conditions:  CNS failure or compromise, endocrine crisis and dehydration  Critical care was time spent personally by me on the following activities:  Development of treatment plan with patient or surrogate, discussions with consultants, evaluation of patient's response to treatment, examination of patient, ordering and review of laboratory studies, ordering and review of radiographic studies, ordering and performing treatments and interventions, pulse oximetry, re-evaluation of patient's condition, review of old charts and obtaining history from patient or  surrogate   Care discussed with: admitting provider       Medications Ordered in ED Medications  levETIRAcetam (KEPPRA) IVPB 1000 mg/100 mL premix (has no administration in time range)    Followed by  levETIRAcetam (KEPPRA) IVPB 1000 mg/100 mL premix (has no administration in time range)    ED Course/ Medical Decision Making/ A&P                          Medical Decision Making Amount and/or Complexity of Data Reviewed Labs: ordered. Decision-making details documented in ED Course. Radiology: ordered.  Risk Decision regarding hospitalization.    This patient presents to the ED for concern of AMS, seizures new onset; this involves an extensive number of treatment options, and is a complaint that carries with it a high risk of complications and morbidity.  I considered the following differential and admission for this acute, potentially life threatening condition.   MDM:    On arrival patient is minimally responsive but does not appear to be actively seizing. He withdraws extremities to pain. Will give 2g IV keppra and obtain CTH/CTA H&N immediately to r/o ICH or LVO. Will also consult with neurology immediately. Patient's BGL read HIGH, high c/f DKA/HHS causing symptoms, so also will give bolus of LR and IV insulin. K reads 5.7, glucose 1,100. He is currently protecting his airway but will monitor very closely for need for intubation. Other causes of seizures include  head injury or TBI, hypoxic-ischemic injury, or underlying epilepsy, vascular malformation such as AVM, electrolyte abnormalities (hyponatremia/hypernatremia, hypomagnesemia, hypocalcemia), uremia, hyperthyroidism. No h/o EtOHism, no tremors/vitals sign abnormalities to suggest alcohol withdrawal. Neurology recommending STAT MRI to r/o stroke and EEG. Received another 2 mg IV ativan to facilitate MRI imaging.  Clinical Course as of 12/14/22 2324  Sat Nov 26, 2022  1842 Glucose-Capillary(!!): >600 [HN]  1954 Lactic  Acid, Venous(!!): 3.9 [HN]  1954 CK Total(!): 619 [HN]  1954 Sodium(!!): 119 [HN]  1954 Glucose(!!): 1,100 [HN]  1954 Creatinine(!): 4.24 [HN]  1954 Potassium(!): 5.7 [HN]  2027 MRI negative per Dr. Iver Nestle [HN]    Clinical Course User Index [HN] Loetta Rough, MD    Labs: I Ordered, and personally interpreted labs.  The pertinent results include:  those listed above  Imaging Studies ordered: I ordered imaging studies including CTH I independently visualized and interpreted imaging. I agree with the radiologist interpretation  Additional history obtained from chart review, EMS, later family at bedside, .    Cardiac Monitoring: .The patient was maintained on a cardiac monitor.  I personally viewed and interpreted the cardiac monitored which showed an underlying rhythm of: NSR  Reevaluation: After the interventions noted above, I reevaluated the patient and found that they have :improved  Social Determinants of Health: .PT lives at facility  Disposition:  Admit to ICU for status epilepticus, c/f HHS  Co morbidities that complicate the patient evaluation .No past medical history on file.   Medicines Meds ordered this encounter  Medications  . FOLLOWED BY Linked Order Group   . levETIRAcetam (KEPPRA)  IVPB 1000 mg/100 mL premix   . levETIRAcetam (KEPPRA) IVPB 1000 mg/100 mL premix    I have reviewed the patients home medicines and have made adjustments as needed  Problem List / ED Course: Problem List Items Addressed This Visit       Nervous and Auditory   * (Principal) Status epilepticus (HCC) - Primary   Relevant Orders   Ambulatory referral to Physical Therapy   Ambulatory referral to Neurology   Other Visit Diagnoses     Hyperosmolar hyperglycemic state (HHS) (HCC)       Relevant Medications   insulin glargine (LANTUS) 100 UNIT/ML Solostar Pen   insulin lispro (HUMALOG) 100 UNIT/ML KwikPen   Glucagon (GVOKE HYPOPEN 2-PACK) 1 MG/0.2ML SOAJ   Other Relevant  Orders   Diet Carb Modified   AKI (acute kidney injury) (HCC)       Hyperkalemia                        This note was created using dictation software, which may contain spelling or grammatical errors.    Loetta Rough, MD 12/16/22 1106

## 2022-11-26 NOTE — Progress Notes (Signed)
eLink Physician-Brief Progress Note Patient Name: Lucas Lin DOB: October 30, 1940 MRN: [id number]   Date of Service  11/26/2022  HPI/Events of Note  82 yr old male presented with seizures secondary to hyperglycemia.  Multiple additional metabolic abnormalities found.  Not intubated.  Treatment started for HHS.  Close observation of resp status.  Neurology has seen.  EEG monitoring ongoing.  eICU Interventions  Chart reviewed     Intervention Category Evaluation Type: New Patient Evaluation  Henry Russel, P 11/26/2022, 11:31 PM

## 2022-11-26 NOTE — Progress Notes (Signed)
STAT EEG complete - results pending. ? ?

## 2022-11-26 NOTE — Progress Notes (Signed)
LTM EEG hooked up and running - no initial skin breakdown - push button tested   

## 2022-11-26 NOTE — Consult Note (Signed)
Neurology Consultation  Reason for Consult: c/f seizure  Requesting Physician: Vivi Barrack  CC: Emesis, poor responsiveness  History is obtained from: Son at bedside, chart review  HPI: Lucas Lin is a 82 y.o. male with a PMHx significant for HTN, HLD, DM   He is active and fully independent at baseline, works Administrator, [employer]. Told his son he hadn't been to work since Tuesday (unclear if he last went Mon or Tues) due to "arthritis" in his shoulder causing it to twitch. He wanted his son's help driving him to an appointment he scheduled for this coming Monday to get it evaluated as it was affecting his ability to drive.   Son was with him today from 10:30 AM to 2:30 PM at which time patient was at his baseline other than intermittent RUE twitching which improved with rest, but was also noted to be intermittently weak. He left briefly, returned at 5 PM and EMS was activated as he found the patient had vomited and was minimally interactive.  Received 15 mg of Versed with EMS for concern for tonic clonic seizure activity.  Received an additional 2 mg Ativan with me for stat MRI   No other recent complaints other than trouble hearing for the past few days which he thought may be due to wax buildup  Unclear medication adherence at this time, patient typically manages his medications himself and son was only able to find some empty bottles, not currently full bottles, on his attempt to find his father's medications.   Per son Negative for any seizure risk factors including: Birth and development normal Febrile seizures in childhood Significant head trauma Intracranial surgeries Mengingitis/Encephalitis history Family history of seizures or developmental delay   LKW: 2:30 PM on 5/3 (waxing waning symptoms since Mon or Tues) Thrombolytic given?: No, MRI negative for stroke  Given substantial worsening and disabling symptoms today, MRI obtained piror to decision making  Delay to  recognition due to possible limb shaking TIA vs. seizure  Checklist of contraindications was reviewed and negative. Risks, benefits and alternatives were discussed  IA performed?: No, MRI negative for stroke Premorbid modified rankin scale: 0-1     0 - No symptoms.     1 - No significant disability. Able to carry out all usual activities, despite some symptoms.   ROS: Unable to obtain due to altered mental status.   No past medical history on file.  No family history on file. Negative for seizures per son  Social History:  reports that he has quit smoking. He has never used smokeless tobacco. No history on file for alcohol use and drug use.  Current Outpatient Medications  Medication Instructions   amLODipine (NORVASC) 10 mg, Oral, Daily   hydrochlorothiazide (HYDRODIURIL) 25 mg, Oral, Daily   losartan (COZAAR) 50 mg, Oral, Daily   oxyCODONE-acetaminophen (PERCOCET) 5-325 MG tablet 1 tablet, Oral, Every 4 hours PRN   pravastatin (PRAVACHOL) 40 mg, Oral, Daily  --> still pending reconciliation  Per 07/2022 office visit: Allergies Allergen (clinical drug ingredient) Drug/Non Drug Allergy documented on EMR Reaction Allergy Type Onset Date Status  atorvastatin Lipitor confusion Drug Allergy   Active  lisinopril Lisinopril angioedema lips Drug Allergy   Active  doxazosin Doxazosin ?cough Drug Allergy   Active   Medications Medication SIG (Take, Route, Frequency, Duration) Notes Start Date End Date Status  Contour Blood Glucose test strips as directed Check CBG's daily       Active  Furosemide 20 MG 1 tablet  Orally Once a day as needed for swelling       Active  Gemfibrozil 600 MG 1 tablet Orally Once a day       Active  Centrum Silver 1 tablet Orally Once a day       Active  Cialis 20 MG 1 tablet Orally Once a day as needed       Active  Rosuvastatin Calcium 10 MG 1 tablet by mouth Once a day       Active  amLODIPine Besylate 5 MG 1 tablet Orally Once a day       Active  Timolol  Maleate 0.5 % 1 drop into affected eye Ophthalmic Once a day   05/26/2022   Active  Aspirin 325 MG 1 tablet Orally Once a day       Active  Tylenol Arthritis Pain 650 MG 2 tablet as needed Orally every 6 hours as needed       Active  Kerendia 10 MG 1 tablet Orally Once a day       Active  Latanoprost 0.005 % 1 drop into affected eye in the evening Ophthalmic Once a day   05/26/2022   Active  Losartan Potassium-HCTZ 50-12.5 MG 1 tablet Orally Once a day for 30 day(s)   07/06/2022   Active     Exam: Current vital signs: BP (!) 109/54   Pulse 79   Temp (!) 97.3 F (36.3 C) (Rectal)   Resp 20   Ht 6' (1.829 m)   Wt 85 kg   SpO2 94%   BMI 25.41 kg/m  Vital signs in last 24 hours: Temp:  [97.3 F (36.3 C)] 97.3 F (36.3 C) (05/04 1837) Pulse Rate:  [79-84] 79 (05/04 1900) Resp:  [20-22] 20 (05/04 1900) BP: (104-109)/(54-56) 109/54 (05/04 1900) SpO2:  [94 %-100 %] 94 % (05/04 1900) Weight:  [85 kg] 85 kg (05/04 1838)   Physical Exam  Constitutional: Appears acute ill Psych: Minimally interactive  Eyes: No scleral injection HENT: No oropharyngeal obstruction. Edentulous, dentures removed and given to family for MRI MSK: no joint deformities.  Cardiovascular: Normal rate and regular rhythm. Perfusing extremities well Respiratory: Gasping breathing  GI: Soft.  No distension. There is no tenderness.  Skin: Warm dry and intact visible skin  Neuro: Mental Status: Patient is minimally interactive, not verbal, not following commands, briefly opens eyes to noxious stim Cranial Nerves: II:  Pupils are equal, round, and reactive to light.   III,IV, VI: EOMI without ptosis or diploplia. Intermittent roving eye movements.  V: Facial sensation is symmetric to light eyelash brush VII: Facial movement is minimal VIII: No clear response to voice X/XI: Cough intact  Sensory/Motor: Localizes LUE, plegic RUE, spontaneously moves bilateral lower extremities grossly  equally Cerebellar/Gait Unable to assess secondary to patient's mental status    I have reviewed labs in epic and the results pertinent to this consultation are:  Basic Metabolic Panel: Recent Labs  Lab 11/26/22 1828 11/26/22 1841 11/26/22 1842  NA 119* 121* 120*  K 5.7* 5.8* 5.8*  CL 82* 90*  --   CO2 17*  --   --   GLUCOSE 1,100* >700*  --   BUN 79* 76*  --   CREATININE 4.24* 4.50*  --   CALCIUM 9.3  --   --   MG 2.7*  --   --   Corrected sodium ~130  CBC: Recent Labs  Lab 11/26/22 1828 11/26/22 1841 11/26/22 1842  WBC 8.5  --   --  NEUTROABS 7.4  --   --   HGB 13.7 14.6 15.0  HCT 39.7 43.0 44.0  MCV 84.3  --   --   PLT 226  --   --     Coagulation Studies: Recent Labs    11/26/22 1828  LABPROT 14.5  INR 1.1      I have reviewed the images obtained:   EKG w/ c/f left atrial enlargement   Impression: Most likely focal seizure with secondary generalization in the setting of uncontrolled glucose;   Emergent recommendations: - MRI to rule out stroke / limb shaking TIA as an etiology of his symptoms  - 2 mg ativan to facilitate imaging  No major diffusion restriction on my read, and patient not tolerating study well with significant head movement despite Ativan; vessel imaging cancelled.   Radiology read pending   Additional Recommendations: - Endotool for glucose management initiated on my request  - Appreciate further management of medical comorbidities per ED / CCM - LTM EEG with regular leads - s/p 2 g Keppra at ~19:00 on 5/4 - Keppra 500 mg q24 hr. Will adjust as needed for renal function and seizure control:  Estimated Creatinine Clearance: 13.9 mL/min (A) (by C-G formula based on SCr of 4.5 mg/dL (H)).   CrCl 80 to 130 mL/minute/1.73 m2: 500 mg to 1.5 g every 12 hours.  CrCl 50 to <80 mL/minute/1.73 m2: 500 mg to 1 g every 12 hours.  CrCl 30 to <50 mL/minute/1.73 m2: 250 to 750 mg every 12 hours.  CrCl 15 to <30 mL/minute/1.73 m2: 250 to  500 mg every 12 hours.  CrCl <15 mL/minute/1.73 m2: 250 to 500 mg every 24 hours (expert opinion). - Neurology will follow along   Brooke Dare MD-PhD [facility] [phone] Available 7 PM to 7 AM, outside of these hours please call Neurologist on call as listed on Amion.    Total critical care time: 60 minutes   Critical care time was exclusive of separately billable procedures and treating other patients.   Critical care was necessary to treat or prevent imminent or life-threatening deterioration, emergent evaluation for status epilepticus versus acute left MCA syndrome with potential to treat with thrombolytic or thrombectomy   Critical care was time spent personally by me on the following activities: development of treatment plan with patient and/or surrogate as well as nursing, discussions with consultants/primary team, evaluation of patient's response to treatment, examination of patient, obtaining history from patient or surrogate, ordering and performing treatments and interventions, ordering and review of laboratory studies, ordering and review of radiographic studies, and re-evaluation of patient's condition as needed, as documented above.

## 2022-11-26 NOTE — ED Notes (Signed)
EEG at bedside.

## 2022-11-26 NOTE — ED Triage Notes (Signed)
Pt coming from home via EMS with seizures. Pt found by family after an hour of not seeing him, seizing with tonic clonic movement. No history of seizures, or trauma. He is a diabetic with a bgl reading HIGH.

## 2022-11-27 DIAGNOSIS — G40901 Epilepsy, unspecified, not intractable, with status epilepticus: Secondary | ICD-10-CM | POA: Diagnosis not present

## 2022-11-27 DIAGNOSIS — I7389 Other specified peripheral vascular diseases: Secondary | ICD-10-CM

## 2022-11-27 LAB — BASIC METABOLIC PANEL
Anion gap: 17 — ABNORMAL HIGH (ref 5–15)
Anion gap: 14 (ref 5–15)
Anion gap: 12 (ref 5–15)
Anion gap: 12 (ref 5–15)
BUN: 74 mg/dL — ABNORMAL HIGH (ref 8–23)
BUN: 70 mg/dL — ABNORMAL HIGH (ref 8–23)
BUN: 65 mg/dL — ABNORMAL HIGH (ref 8–23)
BUN: 63 mg/dL — ABNORMAL HIGH (ref 8–23)
CO2: 25 mmol/L (ref 22–32)
CO2: 24 mmol/L (ref 22–32)
CO2: 26 mmol/L (ref 22–32)
CO2: 25 mmol/L (ref 22–32)
Calcium: 9.8 mg/dL (ref 8.9–10.3)
Calcium: 9.7 mg/dL (ref 8.9–10.3)
Calcium: 9.8 mg/dL (ref 8.9–10.3)
Calcium: 9.5 mg/dL (ref 8.9–10.3)
Chloride: 89 mmol/L — ABNORMAL LOW (ref 98–111)
Chloride: 95 mmol/L — ABNORMAL LOW (ref 98–111)
Chloride: 98 mmol/L (ref 98–111)
Chloride: 97 mmol/L — ABNORMAL LOW (ref 98–111)
Creatinine, Ser: 3.54 mg/dL — ABNORMAL HIGH (ref 0.61–1.24)
Creatinine, Ser: 3.01 mg/dL — ABNORMAL HIGH (ref 0.61–1.24)
Creatinine, Ser: 2.8 mg/dL — ABNORMAL HIGH (ref 0.61–1.24)
Creatinine, Ser: 2.48 mg/dL — ABNORMAL HIGH (ref 0.61–1.24)
GFR, Estimated: 16 mL/min — ABNORMAL LOW (ref >60)
GFR, Estimated: 20 mL/min — ABNORMAL LOW (ref >60)
GFR, Estimated: 22 mL/min — ABNORMAL LOW (ref >60)
GFR, Estimated: 25 mL/min — ABNORMAL LOW (ref >60)
Glucose, Bld: 581 mg/dL (ref 70–99)
Glucose, Bld: 299 mg/dL — ABNORMAL HIGH (ref 70–99)
Glucose, Bld: 159 mg/dL — ABNORMAL HIGH (ref 70–99)
Glucose, Bld: 142 mg/dL — ABNORMAL HIGH (ref 70–99)
Potassium: 3.8 mmol/L (ref 3.5–5.1)
Potassium: 3.6 mmol/L (ref 3.5–5.1)
Potassium: 3.6 mmol/L (ref 3.5–5.1)
Potassium: 3.6 mmol/L (ref 3.5–5.1)
Sodium: 131 mmol/L — ABNORMAL LOW (ref 135–145)
Sodium: 133 mmol/L — ABNORMAL LOW (ref 135–145)
Sodium: 136 mmol/L (ref 135–145)
Sodium: 134 mmol/L — ABNORMAL LOW (ref 135–145)

## 2022-11-27 LAB — GLUCOSE, CAPILLARY
Glucose-Capillary: 145 mg/dL — ABNORMAL HIGH (ref 70–99)
Glucose-Capillary: 148 mg/dL — ABNORMAL HIGH (ref 70–99)
Glucose-Capillary: 150 mg/dL — ABNORMAL HIGH (ref 70–99)
Glucose-Capillary: 150 mg/dL — ABNORMAL HIGH (ref 70–99)
Glucose-Capillary: 152 mg/dL — ABNORMAL HIGH (ref 70–99)
Glucose-Capillary: 158 mg/dL — ABNORMAL HIGH (ref 70–99)
Glucose-Capillary: 171 mg/dL — ABNORMAL HIGH (ref 70–99)
Glucose-Capillary: 188 mg/dL — ABNORMAL HIGH (ref 70–99)
Glucose-Capillary: 227 mg/dL — ABNORMAL HIGH (ref 70–99)
Glucose-Capillary: 301 mg/dL — ABNORMAL HIGH (ref 70–99)
Glucose-Capillary: 393 mg/dL — ABNORMAL HIGH (ref 70–99)
Glucose-Capillary: 410 mg/dL — ABNORMAL HIGH (ref 70–99)
Glucose-Capillary: 441 mg/dL — ABNORMAL HIGH (ref 70–99)
Glucose-Capillary: 521 mg/dL (ref 70–99)
Glucose-Capillary: 542 mg/dL (ref 70–99)
Glucose-Capillary: 573 mg/dL (ref 70–99)
Glucose-Capillary: 590 mg/dL (ref 70–99)
Glucose-Capillary: >600 mg/dL (ref 70–99)
Glucose-Capillary: >600 mg/dL (ref 70–99)
Glucose-Capillary: >600 mg/dL (ref 70–99)

## 2022-11-27 LAB — LACTIC ACID, PLASMA: Lactic Acid, Venous: 2.7 mmol/L (ref 0.5–1.9)

## 2022-11-27 LAB — PHOSPHORUS: Phosphorus: 3.7 mg/dL (ref 2.5–4.6)

## 2022-11-27 LAB — CBC
HCT: 35.5 % — ABNORMAL LOW (ref 39.0–52.0)
Hemoglobin: 12.6 g/dL — ABNORMAL LOW (ref 13.0–17.0)
MCH: 29.3 pg (ref 26.0–34.0)
MCHC: 35.5 g/dL (ref 30.0–36.0)
MCV: 82.6 fL (ref 80.0–100.0)
Platelets: 223 10*3/uL (ref 150–400)
RBC: 4.3 MIL/uL (ref 4.22–5.81)
RDW: 13.9 % (ref 11.5–15.5)
WBC: 11.6 10*3/uL — ABNORMAL HIGH (ref 4.0–10.5)
nRBC: 0 % (ref 0.0–0.2)

## 2022-11-27 LAB — MAGNESIUM: Magnesium: 2.8 mg/dL — ABNORMAL HIGH (ref 1.7–2.4)

## 2022-11-27 LAB — MRSA NEXT GEN BY PCR, NASAL: MRSA by PCR Next Gen: NOT DETECTED

## 2022-11-27 MED ORDER — CHLORHEXIDINE GLUCONATE CLOTH 2 % EX PADS
6.0000 | MEDICATED_PAD | Freq: Every day | CUTANEOUS | Status: DC
  Administered 2022-11-27 – 2022-11-29 (×3): 6 via TOPICAL

## 2022-11-27 MED ORDER — INSULIN DETEMIR 100 UNIT/ML ~~LOC~~ SOLN
20.0000 [IU] | Freq: Two times a day (BID) | SUBCUTANEOUS | Status: DC
  Administered 2022-11-27 – 2022-11-29 (×5): 20 [IU] via SUBCUTANEOUS
  Filled 2022-11-27 (×7): qty 0.2

## 2022-11-27 MED ORDER — INSULIN ASPART 100 UNIT/ML IJ SOLN
1.0000 [IU] | INTRAMUSCULAR | Status: DC
  Administered 2022-11-27: 2 [IU] via SUBCUTANEOUS
  Administered 2022-11-27: 1 [IU] via SUBCUTANEOUS
  Administered 2022-11-28: 2 [IU] via SUBCUTANEOUS
  Administered 2022-11-28: 1 [IU] via SUBCUTANEOUS

## 2022-11-27 NOTE — Progress Notes (Signed)
NAME:  Lucas Lin, MRN:  [id number], DOB:  1940/11/13, LOS: 1 ADMISSION DATE:  11/26/2022, CONSULTATION DATE:  5/4 REFERRING MD:  Dr. Jearld Fenton, CHIEF COMPLAINT:  seizure   History of Present Illness:  Patient is a 82 yo M w/ pertinent PMH DMT2, htn, hld presents to [facility] on 5/4 w/ seizures.   Patient has history of DMT2 but not on any medications. Per son states he was on insulin but quit taking about a year ago because sugars have improved. In 2010 patient was admitted w/ HHS w/ seizure activity. Patient was non compliant w/ home diabetes medications. Patient had RUE twitching.   On 5/4, patient was found down having tonic clonic seizure activity. LKN 2 hour before being found down. No hx of seizures. Patient has been complaining fo RUE twitching over the last few days that improves w/ rest. Upon EMS arrival, patient minimally interactive and had vomited. Patient given versed and transferred to [facility] ED. Upon arrival patient hemodynamically stable and afebrile. Loaded w/ keppra. Neuro consulted. CT head no acute abnormality. Taken to MRI and given ativan; results pending. EEG pending.  Na 119 and glucose 1,100. AG 20, co2 17. VBG 7.30, 39, 115, 19. UA without ketones. Patient started on insulin drip and given iv fluids. PCCM consulted for icu admission.  Pertinent ED labs: creat 4.24, LA 3.9, ck 619   Pertinent  Medical History  HTN HLD DMT2  Significant Hospital Events: Including procedures, antibiotic start and stop dates in addition to other pertinent events   5/4 seizure activity; EEG pending; HHS started on insulin and given iv fluids  Interim History / Subjective:  Overnight EEG showed no active seizures Patient is awake, mental status is improved significantly Remain afebrile  Objective   Blood pressure 104/71, pulse 69, temperature 98 F (36.7 C), temperature source Axillary, resp. rate 13, height 6' (1.829 m), weight 85 kg, SpO2 100 %.        Intake/Output Summary (Last  24 hours) at 11/27/2022 1010 Last data filed at 11/27/2022 0900 Gross per 24 hour  Intake 3152.31 ml  Output 1750 ml  Net 1402.31 ml   Filed Weights   11/26/22 1838  Weight: 85 kg    Examination: Physical exam: General: Acute on chronically ill-appearing male, lying on the bed HEENT: [place]/AT, eyes anicteric.  Dry mucus membranes Neuro: Awake, oriented, moving all 4 extremities Chest: Coarse breath sounds, no wheezes or rhonchi Heart: Regular rate and rhythm, no murmurs or gallops Abdomen: Soft, nontender, nondistended, bowel sounds present Skin: No rash  Labs and images were reviewed  Resolved Hospital Problem list     Assessment & Plan:  Multiple seizures in the setting of HHS Acute metabolic encephalopathy in the setting of HHS and AKI CT head showed no acute abnormalities MRI brain showed left parietal calvarium lesion, likely metastatic disease EEG showed no active seizures Loaded with Keppra overnight Continue Keppra 500 mg daily Continue seizure precautions Avoid sedation  Hyperosmolar hyperglycemia syndrome Poorly controlled diabetes type 2 with hyperglycemia  Per son was on insulin before but taken off a year ago because he improved w/ diet change/exercise Hemoglobin A1c is pending He presented with blood sugar over 600 Still on insulin per Endo tool for HHS Will transition to long-acting insulin with Levemir 20 units twice daily Monitor fingerstick Continue IV fluid Monitor electrolytes  AKI in the setting of severe dehydration caused by HHS Pseudohyponatremia in the setting of hyperglycemia Patient presented with serum creatinine of 4.2, we  do not know baseline as he had labs done in 2010 with our system Serum creatinine continue to trend down Continue aggressive IV fluid Monitor intake and output Avoid nephrotoxic agents Corrected serum sodium is 135  Lactic acidosis, due to seizure and severe dehydration Lactate is trending down  HTN HLD Hold home  anti-hypertensive's/statin while npo  Left-sided skull lesion noted on MRI Needs outpatient workup for malignancy   Best Practice (right click and "Reselect all SmartList Selections" daily)   Diet/type: NPO SLP evaluation DVT prophylaxis: prophylactic heparin  GI prophylaxis: N/A Lines: N/A Foley:  N/A Code Status:  full code Last date of multidisciplinary goals of care discussion [5/4 spoke w/ son at bedside; states no living will at home. Would want intubation and cpr if needed.]  Labs   CBC: Recent Labs  Lab 11/26/22 1828 11/26/22 1841 11/26/22 1842 11/27/22 0607  WBC 8.5  --   --  11.6*  NEUTROABS 7.4  --   --   --   HGB 13.7 14.6 15.0 12.6*  HCT 39.7 43.0 44.0 35.5*  MCV 84.3  --   --  82.6  PLT 226  --   --  223    Basic Metabolic Panel: Recent Labs  Lab 11/26/22 1828 11/26/22 1841 11/26/22 1842 11/26/22 2104 11/27/22 0057 11/27/22 0607  NA 119* 121* 120* 121* 131* 133*  K 5.7* 5.8* 5.8* 5.4* 3.8 3.6  CL 82* 90*  --  86* 89* 95*  CO2 17*  --   --  17* 25 24  GLUCOSE 1,100* >700*  --  1,036* 581* 299*  BUN 79* 76*  --  83* 74* 70*  CREATININE 4.24* 4.50*  --  3.86* 3.54* 3.01*  CALCIUM 9.3  --   --  9.0 9.8 9.7  MG 2.7*  --   --   --   --  2.8*  PHOS  --   --   --   --   --  3.7   GFR: Estimated Creatinine Clearance: 20.8 mL/min (A) (by C-G formula based on SCr of 3.01 mg/dL (H)). Recent Labs  Lab 11/26/22 1828 11/26/22 2104 11/27/22 0607  WBC 8.5  --  11.6*  LATICACIDVEN 3.9* 2.3* 2.7*    Liver Function Tests: Recent Labs  Lab 11/26/22 1828  AST 29  ALT 20  ALKPHOS 131*  BILITOT 1.0  PROT 7.9  ALBUMIN 3.9   Recent Labs  Lab 11/26/22 2104  LIPASE 78*   No results for input(s): "AMMONIA" in the last 168 hours.  ABG    Component Value Date/Time   HCO3 19.7 (L) 11/26/2022 1842   TCO2 21 (L) 11/26/2022 1842   ACIDBASEDEF 6.0 (H) 11/26/2022 1842   O2SAT 98 11/26/2022 1842     Coagulation Profile: Recent Labs  Lab  11/26/22 1828 11/26/22 1936  INR 1.1 1.2    Cardiac Enzymes: Recent Labs  Lab 11/26/22 1828  CKTOTAL 619*    HbA1C: Hgb A1c MFr Bld  Date/Time Value Ref Range Status  05/03/2009 03:30 PM (H) 4.6 - 6.1 % Final   11.5 (NOTE) The ADA recommends the following therapeutic goal for glycemic control related to Hgb A1c measurement: Goal of therapy: <6.5 Hgb A1c  Reference: American Diabetes Association: Clinical Practice Recommendations 2010, Diabetes Care, 2010, 33: (Suppl  1).    CBG: Recent Labs  Lab 11/27/22 0501 11/27/22 0604 11/27/22 0710 11/27/22 0812 11/27/22 0920  GLUCAP 393* 301* 227* 171* 150*      Cheri Fowler, MD  [facility] See Amion for pager If no response to pager, please call [phone] until 7pm After 7pm, Please call E-link [phone]

## 2022-11-27 NOTE — Progress Notes (Addendum)
Neurology Progress Note   S:// Seen and examined. Awake alert oriented x 2   O:// Current vital signs: BP 113/66   Pulse 67   Temp 98 F (36.7 C) (Axillary)   Resp 17   Ht 6' (1.829 m)   Wt 85 kg   SpO2 94%   BMI 25.41 kg/m  Vital signs in last 24 hours: Temp:  [96.4 F (35.8 C)-98 F (36.7 C)] 98 F (36.7 C) (05/05 0800) Pulse Rate:  [64-84] 67 (05/05 1100) Resp:  [12-23] 17 (05/05 1100) BP: (81-139)/(54-81) 113/66 (05/05 1100) SpO2:  [90 %-100 %] 94 % (05/05 1100) Weight:  [85 kg] 85 kg (05/04 1838) General: Awake alert oriented x 2 HEENT: Normocephalic dermatic Chest: Clear Cardiovascular regular rhythm Abdomen nondistended nontender Neurologic exam Awake alert oriented x 2 Edentulous with moderate dysarthria No aphasia Poor attention concentration Cranial nerves II to XII intact Motor examination with no drift Sensation intact light touch Coordination exam difficult given poor attention concentration but no gross dysmetria noted.  Medications  Current Facility-Administered Medications:    acetaminophen (TYLENOL) suppository 650 mg, 650 mg, Rectal, Q4H PRN **OR** acetaminophen (TYLENOL) tablet 650 mg, 650 mg, Oral, Q6H PRN, Craige Cotta, Vineet, MD   Chlorhexidine Gluconate Cloth 2 % PADS 6 each, 6 each, Topical, Daily, Chand, Sudham, MD   dextrose 5 % in lactated ringers infusion, , Intravenous, Continuous, Sood, Vineet, MD, Last Rate: 125 mL/hr at 11/27/22 1100, Infusion Verify at 11/27/22 1100   dextrose 50 % solution 0-50 mL, 0-50 mL, Intravenous, PRN, Coralyn Helling, MD   heparin injection 5,000 Units, 5,000 Units, Subcutaneous, Q8H, Sood, Vineet, MD, 5,000 Units at 11/27/22 0609   insulin aspart (novoLOG) injection 1-3 Units, 1-3 Units, Subcutaneous, Q4H, Chand, Sudham, MD   insulin detemir (LEVEMIR) injection 20 Units, 20 Units, Subcutaneous, BID, Chand, Sudham, MD, 20 Units at 11/27/22 1042   insulin regular, human (MYXREDLIN) 100 units/ 100 mL infusion, ,  Intravenous, Continuous, Sood, Vineet, MD, Last Rate: 2.4 mL/hr at 11/27/22 1100, Infusion Verify at 11/27/22 1100   lactated ringers infusion, , Intravenous, Continuous, Sood, Vineet, MD, Stopped at 11/27/22 0711   levETIRAcetam (KEPPRA) IVPB 500 mg/100 mL premix, 500 mg, Intravenous, Q24H **OR** levETIRAcetam (KEPPRA) tablet 500 mg, 500 mg, Oral, Q24H, Bhagat, Srishti L, MD Labs CBC    Component Value Date/Time   WBC 11.6 (H) 11/27/2022 0607   RBC 4.30 11/27/2022 0607   HGB 12.6 (L) 11/27/2022 0607   HCT 35.5 (L) 11/27/2022 0607   PLT 223 11/27/2022 0607   MCV 82.6 11/27/2022 0607   MCH 29.3 11/27/2022 0607   MCHC 35.5 11/27/2022 0607   RDW 13.9 11/27/2022 0607   LYMPHSABS 0.7 11/26/2022 1828   MONOABS 0.4 11/26/2022 1828   EOSABS 0.0 11/26/2022 1828   BASOSABS 0.1 11/26/2022 1828    CMP     Component Value Date/Time   NA 136 11/27/2022 0956   K 3.6 11/27/2022 0956   CL 98 11/27/2022 0956   CO2 26 11/27/2022 0956   GLUCOSE 159 (H) 11/27/2022 0956   BUN 65 (H) 11/27/2022 0956   CREATININE 2.80 (H) 11/27/2022 0956   CALCIUM 9.8 11/27/2022 0956   PROT 7.9 11/26/2022 1828   ALBUMIN 3.9 11/26/2022 1828   AST 29 11/26/2022 1828   ALT 20 11/26/2022 1828   ALKPHOS 131 (H) 11/26/2022 1828   BILITOT 1.0 11/26/2022 1828   GFRNONAA 22 (L) 11/27/2022 0956   GFRAA  05/05/2009 0400    >60  The eGFR has been calculated using the MDRD equation. This calculation has not been validated in all clinical situations. eGFR's persistently <60 mL/min signify possible Chronic Kidney Disease.     Lipid Panel     Component Value Date/Time   CHOL  05/04/2009 0500    196        ATP III CLASSIFICATION:  <200     mg/dL   Desirable  161-096  mg/dL   Borderline High  >=045    mg/dL   High          TRIG 409 (H) 05/04/2009 0500   HDL 39 (L) 05/04/2009 0500   CHOLHDL 5.0 05/04/2009 0500   VLDL UNABLE TO CALCULATE IF TRIGLYCERIDE OVER 400 mg/dL 81/19/1478 2956   LDLCALC   05/04/2009 0500    UNABLE TO CALCULATE IF TRIGLYCERIDE OVER 400 mg/dL        Total Cholesterol/HDL:CHD Risk Coronary Heart Disease Risk Table                     Men   Women  1/2 Average Risk   3.4   3.3  Average Risk       5.0   4.4  2 X Average Risk   9.6   7.1  3 X Average Risk  23.4   11.0        Use the calculated Patient Ratio above and the CHD Risk Table to determine the patient's CHD Risk.        ATP III CLASSIFICATION (LDL):  <100     mg/dL   Optimal  213-086  mg/dL   Near or Above                    Optimal  130-159  mg/dL   Borderline  578-469  mg/dL   High  >629     mg/dL   Very High    Overnight LTM EEG- Normal.  Multiple episodes of twitching noted clinically likely not seizures given no EEG correlate. Likely events related to hyperglycemia  Imaging I have reviewed images in epic and the results pertinent to this consultation are: MRI brain negative for acute process.  Assessment: 82 year old presenting with focal seizure with secondary generalization in the setting of uncontrolled sugars in the 1100s.  With control of sugars, he is returning back to baseline mentation and has no further seizure activity.  He had some twitching overnight which was not accompanied by any EEG correlate. At this time, I will discontinue the LTM  Impression: Provoked seizure and now resolved status epilepticus in the setting of hyperglycemia  Recommendations: For now, I would continue Keppra 500 twice daily since he had prolonged seizures for multiple days. This may be discontinued in a few months after outpatient follow-ups but I would not take him off of Keppra for now. Maintain seizure precautions Correction of toxic metabolic derangements per primary team as you are Inpatient neurology will be available with questions as needed Plan was discussed with Dr. Merrily Pew   -- Milon Dikes, MD Neurologist [facility] Pager: [phone]   CRITICAL CARE  ATTESTATION Performed by: Milon Dikes, MD Total critical care time: 33 minutes Critical care time was exclusive of separately billable procedures and treating other patients and/or supervising APPs/Residents/Students Critical care was necessary to treat or prevent imminent or life-threatening deterioration. This patient is critically ill and at significant risk for neurological worsening and/or death and care requires constant monitoring. Critical care was time spent personally  by me on the following activities: development of treatment plan with patient and/or surrogate as well as nursing, discussions with consultants, evaluation of patient's response to treatment, examination of patient, obtaining history from patient or surrogate, ordering and performing treatments and interventions, ordering and review of laboratory studies, ordering and review of radiographic studies, pulse oximetry, re-evaluation of patient's condition, participation in multidisciplinary rounds and medical decision making of high complexity in the care of this patient.

## 2022-11-27 NOTE — Procedures (Addendum)
Patient Name: SLAYDEN SCHUPP  MRN: [id number]  Epilepsy Attending: Charlsie Quest  Referring Physician/Provider: Loetta Rough, MD  Date: 11/26/2022 Duration: 29.25 mins  Patient history: 82yo M with intermittent RUE twitching. EEG to evaluate for seizure  Level of alertness: asleep  AEDs during EEG study: LEV, ativan  Technical aspects: This EEG study was done with scalp electrodes positioned according to the 10-20 International system of electrode placement. Electrical activity was reviewed with band pass filter of 1-70Hz , sensitivity of 7 uV/mm, display speed of 62mm/sec with a 60Hz  notched filter applied as appropriate. EEG data were recorded continuously and digitally stored.  Video monitoring was available and reviewed as appropriate.  Description: Sleep was characterized by sleep spindles (12 to 14 Hz), maximal frontocentral region. Hyperventilation and photic stimulation were not performed.     IMPRESSION: This study during sleep only is within normal limits. No seizures or epileptiform discharges were seen throughout the recording.  A normal interictal EEG does not exclude the diagnosis of epilepsy.  Dorlis Judice Annabelle Harman

## 2022-11-27 NOTE — Progress Notes (Signed)
LTM EEG discontinued - no skin breakdown at unhook.   

## 2022-11-27 NOTE — Progress Notes (Signed)
eLink Physician-Brief Progress Note Patient Name: Lucas Lin DOB: 1940/10/02 MRN: [id number]   Date of Service  11/27/2022  HPI/Events of Note  Notified by bedside nurse patient with glucose still greater than 600.  Presently on insulin drip 11 units/hr.  Presented with glucose greater than 1000.  Nurse instructed to press on with protocol.  Nurse also concerned patient with hand twitching indicating seizure.  She was instructed to reach out to neurology to review EEG and symptoms.  Patient receiving keppra.  If needs benzodiazepines acutely we may need to intubate.  eICU Interventions  As above will continue to follow closely     Intervention Category Major Interventions: Other:  Henry Russel, P 11/27/2022, 12:00 AM

## 2022-11-27 NOTE — Progress Notes (Signed)
Notified about a brief episode of twitching.  This does look like a focal seizure on video, no clear correlate for me on review of EEG.  Would continue to focus on glucose control at this time, given patient's somnolence hesitate to add additional seizure medication at this time  I will continue to monitor video EEG intermittently and monitor requested for patient events  Brooke Dare MD-PhD [facility] [phone]   An additional 10 min of care

## 2022-11-27 NOTE — Procedures (Signed)
Patient Name: Lucas Lin  MRN: [id number]  Epilepsy Attending: Charlsie Quest  Referring Physician/Provider: Gordy Councilman, MD  Duration: 11/26/2022 2330 to 4098   Patient history: 82yo M with intermittent RUE twitching. EEG to evaluate for seizure   Level of alertness: awake, asleep   AEDs during EEG study: LEV, ativan   Technical aspects: This EEG study was done with scalp electrodes positioned according to the 10-20 International system of electrode placement. Electrical activity was reviewed with band pass filter of 1-70Hz , sensitivity of 7 uV/mm, display speed of 95mm/sec with a 60Hz  notched filter applied as appropriate. EEG data were recorded continuously and digitally stored.  Video monitoring was available and reviewed as appropriate.   Description:  The posterior dominant rhythm consists of 8-9 Hz activity of moderate voltage (25-35 uV) seen predominantly in posterior head regions, symmetric and reactive to eye opening and eye closing. Sleep was characterized by sleep spindles (12 to 14 Hz), maximal frontocentral region. Sharply contoured waves were noted in left temporo-parietal region.  Event button was pressed on 11/27/2022 at 2346 for head twitching.  Concomitant EEG before, during and after the event did not show any EEG changes to suggest seizure.   Hyperventilation and photic stimulation were not performed.      IMPRESSION: This study during sleep only is within normal limits. No seizures or epileptiform discharges were seen throughout the recording.  Event button was pressed on 11/27/2022 at 2346 for head twitching without concomitant EEG change. This was most likely NOT an epileptic event.   A normal interictal EEG does not exclude the diagnosis of epilepsy.   Jaclin Finks Annabelle Harman

## 2022-11-28 ENCOUNTER — Ambulatory Visit: Payer: 59 | Admitting: Surgical

## 2022-11-28 DIAGNOSIS — G40901 Epilepsy, unspecified, not intractable, with status epilepticus: Secondary | ICD-10-CM

## 2022-11-28 LAB — GLUCOSE, CAPILLARY
Glucose-Capillary: 120 mg/dL — ABNORMAL HIGH (ref 70–99)
Glucose-Capillary: 137 mg/dL — ABNORMAL HIGH (ref 70–99)
Glucose-Capillary: 325 mg/dL — ABNORMAL HIGH (ref 70–99)
Glucose-Capillary: 288 mg/dL — ABNORMAL HIGH (ref 70–99)
Glucose-Capillary: 254 mg/dL — ABNORMAL HIGH (ref 70–99)
Glucose-Capillary: 219 mg/dL — ABNORMAL HIGH (ref 70–99)

## 2022-11-28 LAB — HEMOGLOBIN A1C
Hgb A1c MFr Bld: 12.9 % — ABNORMAL HIGH (ref 4.8–5.6)
Mean Plasma Glucose: 324 mg/dL

## 2022-11-28 LAB — LIPASE, BLOOD: Lipase: 43 U/L (ref 11–51)

## 2022-11-28 MED ORDER — LEVETIRACETAM 500 MG PO TABS
500.0000 mg | ORAL_TABLET | Freq: Two times a day (BID) | ORAL | Status: DC
  Administered 2022-11-28 – 2022-12-01 (×6): 500 mg via ORAL
  Filled 2022-11-28 (×7): qty 1

## 2022-11-28 MED ORDER — SODIUM CHLORIDE 0.9 % IV SOLN: INTRAVENOUS | Status: DC

## 2022-11-28 MED ORDER — INSULIN ASPART 100 UNIT/ML IJ SOLN
0.0000 [IU] | Freq: Every day | INTRAMUSCULAR | Status: DC
  Administered 2022-11-28: 2 [IU] via SUBCUTANEOUS
  Administered 2022-11-29: 4 [IU] via SUBCUTANEOUS
  Administered 2022-11-30: 5 [IU] via SUBCUTANEOUS

## 2022-11-28 MED ORDER — LEVETIRACETAM IN NACL 500 MG/100ML IV SOLN
500.0000 mg | Freq: Two times a day (BID) | INTRAVENOUS | Status: DC
  Administered 2022-11-29: 500 mg via INTRAVENOUS
  Filled 2022-11-28 (×2): qty 100

## 2022-11-28 MED ORDER — AMLODIPINE BESYLATE 5 MG PO TABS
5.0000 mg | ORAL_TABLET | Freq: Every day | ORAL | Status: DC
  Administered 2022-11-28 – 2022-12-01 (×4): 5 mg via ORAL
  Filled 2022-11-28 (×4): qty 1

## 2022-11-28 MED ORDER — INSULIN ASPART 100 UNIT/ML IJ SOLN
0.0000 [IU] | Freq: Three times a day (TID) | INTRAMUSCULAR | Status: DC
  Administered 2022-11-28: 5 [IU] via SUBCUTANEOUS
  Administered 2022-11-28: 7 [IU] via SUBCUTANEOUS
  Administered 2022-11-29: 3 [IU] via SUBCUTANEOUS
  Administered 2022-11-30: 9 [IU] via SUBCUTANEOUS
  Administered 2022-11-30: 5 [IU] via SUBCUTANEOUS
  Administered 2022-11-30 – 2022-12-01 (×3): 2 [IU] via SUBCUTANEOUS

## 2022-11-28 NOTE — Progress Notes (Signed)
PROGRESS NOTE  Lucas Lin  DOB: 05-May-1941  PCP: Clovis Riley, [name], MD ZOX:[id number]  DOA: 11/26/2022  LOS: 2 days  Hospital Day: 3  Brief narrative: Lucas Lin is a 82 y.o. male with PMH significant for DM2, HTN, HLD and h/o HHS with seizures 2010. 5/4, patient was found down having a tonic-clonic seizure.  Last known normal about 2 hours prior.  Per report, he was complaining of right upper extremity twitching over the last few days. Upon EMS arrival, patient was minimally interactive and had vomited. Patient was given versed and transferred to [facility] ED.  In the ED, patient was afebrile, hemodynamically stable.  Loaded with Keppra  Neurology consulted  CT head unremarkable  MRI brain unremarkable for acute intracranial abnormality but showed a nonspecific lucency in the left parietal calvarium suspicious for metastasis.  Initial labs with CBC unremarkable, lactic acid elevated to 3.9, sodium level low at 119, potassium elevated at 5.7, glucose 1100, BUN/creatinine 79/4.24, anion gap elevated to 20 VBG with pH low at 7.3, beta-hydroxybutyrate level elevated in blood but UA without ketones. Patient was started on IV fluid, IV insulin drip. PCCM consulted and admitted to ICU 5/6, transferred out to [facility]  Subjective: Patient was seen and examined this morning.  Pleasant elderly African-American male.  Sitting up in recliner.  Not in distress.  Not in pain.  Alert, awake, oriented x 3 but slow to respond.  No family at bedside. Chart reviewed In the last 24 hours, no fever, heart rate in 60s, blood pressure in 140s to 160s Last set of labs from 5/5 with sodium 134, BUN/creatinine 63/2.48  Assessment and plan: Status epilepticus in the setting of hyperglycemia Acute metabolic encephalopathy CT head and MRI brain did not show any intracranial normality MRI brain showed left parietal calvarium lesion, likely metastatic disease While in ICU, patient had prolonged seizures for  multiple days. Neurology was consulted EEG was obtained which did not show any epileptiform discharges throughout the duartion of recording. Per neurology recommendation 5/5, to continue Keppra 500 mg twice daily for few months.  To follow-up with neurology as an outpatient   Diabetic ketoacidosis  Hyperosmolar hyperglycemia syndrome On admission, patient had blood sugar elevated over 1000, serum ketone level elevated, anion gap elevated, acidotic pH 7.3.  He met criteria for both DKA and HHS Given aggressive IV hydration. Initially treated with insulin drip and later switched to subcu insulin.  Type 2 diabetes mellitus uncontrolled A1c 12.9 on 11/26/2022 PTA not on meds Currently on Levemir 20 units twice daily with SSI/Accu-Cheks. Recent Labs  Lab 11/27/22 1509 11/27/22 1918 11/27/22 2331 11/28/22 0334 11/28/22 0818  GLUCAP 145* 188* 158* 120* 137*   AKI Presented with creatinine elevated 4.2.  Unclear baseline because prior to this presentation, patient did not have labs for several years.  Gradually improving creatinine.  I will resume IV fluid at 75 mill per hour of normal saline for next 24 hours.  Continue to monitor renal function. Recent Labs    11/26/22 1828 11/26/22 1841 11/26/22 2104 11/27/22 0057 11/27/22 0607 11/27/22 0956 11/27/22 1405  BUN 79* 76* 83* 74* 70* 65* 63*  CREATININE 4.24* 4.50* 3.86* 3.54* 3.01* 2.80* 2.48*   Pseudohyponatremia Initial major sodium level was significantly low at 119 but corrected sodium level was better at 135. Sodium level showed improvement with correction of blood sugar level Continue to monitor. Recent Labs  Lab 11/26/22 1828 11/26/22 1841 11/26/22 1842 11/26/22 2104 11/27/22 8119 11/27/22 1478 11/27/22 2956  11/27/22 1405  NA 119* 121* 120* 121* 131* 133* 136 134*   HTN Blood pressure elevated to 140s and 160s in the last 24 hours. PTA on amlodipine, Lasix, HCTZ, losartan.  Unsure of compliance Currently not on  any BP meds. Will resume amlodipine at 5 mg daily.  Avoid ARB, diuretics at this time because of AKI IV hydralazine as needed continue  HLD Obtain lipid panel  Left-sided skull lesion noted on MRI Needs outpatient workup for malignancy    Mobility: PT eval pending  Goals of care   Code Status: Full Code     DVT prophylaxis:  heparin injection 5,000 Units Start: 11/26/22 2130   Antimicrobials: None currently Fluid: NS at 75 mill per hour Consultants: Neurology, PCCM Family Communication: None at bedside  Status: Inpatient Level of care:  Progressive   Patient from: Home Anticipated d/c to: Pending clinical course Needs to continue in-hospital care:  Improving creatinine, pending PT eval and disposition plan   Diet:  Diet Order             Diet Carb Modified Fluid consistency: Thin; Room service appropriate? Yes  Diet effective now                   Scheduled Meds:  amLODipine  5 mg Oral Daily   Chlorhexidine Gluconate Cloth  6 each Topical Daily   heparin injection (subcutaneous)  5,000 Units Subcutaneous Q8H   insulin aspart  0-5 Units Subcutaneous QHS   insulin aspart  0-9 Units Subcutaneous TID WC   insulin detemir  20 Units Subcutaneous BID   levETIRAcetam  500 mg Oral Q24H    PRN meds: acetaminophen **OR** acetaminophen, dextrose   Infusions:   sodium chloride     levETIRAcetam      Antimicrobials: Anti-infectives (From admission, onward)    None       Nutritional status:  Body mass index is 25.41 kg/m.          Objective: Vitals:   11/28/22 0800 11/28/22 0900  BP: (!) 150/82 (!) 148/90  Pulse: 77 83  Resp: (!) 9 17  Temp: 98.3 F (36.8 C)   SpO2: 100% 97%    Intake/Output Summary (Last 24 hours) at 11/28/2022 1043 Last data filed at 11/28/2022 1000 Gross per 24 hour  Intake 1112.93 ml  Output 2675 ml  Net -1562.07 ml   Filed Weights   11/26/22 1838  Weight: 85 kg   Weight change:  Body mass index is 25.41  kg/m.   Physical Exam: General exam: Pleasant, elderly African-American male.  Not in pain Skin: No rashes, lesions or ulcers. HEENT: Atraumatic, normocephalic, no obvious bleeding Lungs: Clear to auscultation bilaterally CVS: Regular rate and rhythm, no murmur GI/Abd soft, nontender, nondistended, bowel sound present CNS: Alert, awake, oriented x 3.  Slow to respond Psychiatry: Mood appropriate Extremities: No pedal edema, no calf tenderness  Data Review: I have personally reviewed the laboratory data and studies available.  F/u labs ordered Unresulted Labs (From admission, onward)     Start     Ordered   11/28/22 0927  Lipase, blood  Add-on,   AD       Question:  Specimen collection method  Answer:  Lab=Lab collect   11/28/22 0926   Unscheduled  Basic metabolic panel  Tomorrow morning,   R       Question:  Specimen collection method  Answer:  Lab=Lab collect   11/28/22 1043   Unscheduled  CBC with  Differential/Platelet  Tomorrow morning,   R       Question:  Specimen collection method  Answer:  Lab=Lab collect   11/28/22 1043            Total time spent in review of labs and imaging, patient evaluation, formulation of plan, documentation and communication with family: 55 minutes  Signed, Lorin Glass, MD [facility] 11/28/2022

## 2022-11-28 NOTE — Inpatient Diabetes Management (Signed)
Inpatient Diabetes Program Recommendations  AACE/ADA: New Consensus Statement on Inpatient Glycemic Control (2015)  Target Ranges:  Prepandial:   less than 140 mg/dL      Peak postprandial:   less than 180 mg/dL (1-2 hours)      Critically ill patients:  140 - 180 mg/dL   Lab Results  Component Value Date   GLUCAP 288 (H) 11/28/2022   HGBA1C 12.9 (H) 11/26/2022    Review of Glycemic Control  Diabetes history: DM2 Outpatient Diabetes medications: None Current orders for Inpatient glycemic control: Levemir 20 BID, Novolog 0-9 units TID with meals and 0-5 HS  HgbA1C - 12.9%  Pt's son says he was previously on insulin, but was taken off DM medications.   Inpatient Diabetes Program Recommendations:    Consider adding meal coverage insulin - Novolog 4 units TID with meals if eating > 50%  Will order Living Well with Diabetes book and Insulin pen starter kit  Will discuss diabetes, HgbA1C of 12.9% and going home on insulin when transferred out of ICU. Teach insulin pen administration (pt and family)  Continue to follow.  Thank you. Ailene Ards, RD, LDN, CDCES Inpatient Diabetes Coordinator [phone]

## 2022-11-28 NOTE — Progress Notes (Signed)
  Transition [facility]) Screening Note   Patient Details  Name: Lucas Lin Date of Birth: 07/10/1941   Transition of Care [facility]) CM/SW Contact:    Mearl Latin, LCSW Phone Number: 11/28/2022, 9:00 AM    Transition of Care Department [facility]) has reviewed patient from home with seizures. We will continue to monitor patient advancement through interdisciplinary progression rounds. If new patient transition needs arise, please place a TOC consult.

## 2022-11-29 ENCOUNTER — Inpatient Hospital Stay (HOSPITAL_COMMUNITY): Payer: 59

## 2022-11-29 ENCOUNTER — Encounter (HOSPITAL_COMMUNITY): Payer: Self-pay | Admitting: Pulmonary Disease

## 2022-11-29 ENCOUNTER — Other Ambulatory Visit: Payer: Self-pay

## 2022-11-29 DIAGNOSIS — G40901 Epilepsy, unspecified, not intractable, with status epilepticus: Secondary | ICD-10-CM | POA: Diagnosis not present

## 2022-11-29 LAB — CBC WITH DIFFERENTIAL/PLATELET
Abs Immature Granulocytes: 0.02 10*3/uL (ref 0.00–0.07)
Basophils Absolute: 0.1 10*3/uL (ref 0.0–0.1)
Basophils Relative: 1 %
Eosinophils Absolute: 0.2 10*3/uL (ref 0.0–0.5)
Eosinophils Relative: 2 %
HCT: 39.2 % (ref 39.0–52.0)
Hemoglobin: 13.2 g/dL (ref 13.0–17.0)
Immature Granulocytes: 0 %
Lymphocytes Relative: 21 %
Lymphs Abs: 1.8 10*3/uL (ref 0.7–4.0)
MCH: 28.8 pg (ref 26.0–34.0)
MCHC: 33.7 g/dL (ref 30.0–36.0)
MCV: 85.4 fL (ref 80.0–100.0)
Monocytes Absolute: 0.9 10*3/uL (ref 0.1–1.0)
Monocytes Relative: 10 %
Neutro Abs: 5.6 10*3/uL (ref 1.7–7.7)
Neutrophils Relative %: 66 %
Platelets: 198 10*3/uL (ref 150–400)
RBC: 4.59 MIL/uL (ref 4.22–5.81)
RDW: 14.3 % (ref 11.5–15.5)
WBC: 8.5 10*3/uL (ref 4.0–10.5)
nRBC: 0 % (ref 0.0–0.2)

## 2022-11-29 LAB — BASIC METABOLIC PANEL
Anion gap: 10 (ref 5–15)
BUN: 33 mg/dL — ABNORMAL HIGH (ref 8–23)
CO2: 23 mmol/L (ref 22–32)
Calcium: 9.2 mg/dL (ref 8.9–10.3)
Chloride: 99 mmol/L (ref 98–111)
Creatinine, Ser: 1.42 mg/dL — ABNORMAL HIGH (ref 0.61–1.24)
GFR, Estimated: 49 mL/min — ABNORMAL LOW (ref >60)
Glucose, Bld: 144 mg/dL — ABNORMAL HIGH (ref 70–99)
Potassium: 3.7 mmol/L (ref 3.5–5.1)
Sodium: 132 mmol/L — ABNORMAL LOW (ref 135–145)

## 2022-11-29 LAB — GLUCOSE, CAPILLARY
Glucose-Capillary: 95 mg/dL (ref 70–99)
Glucose-Capillary: 91 mg/dL (ref 70–99)
Glucose-Capillary: 201 mg/dL — ABNORMAL HIGH (ref 70–99)
Glucose-Capillary: 317 mg/dL — ABNORMAL HIGH (ref 70–99)

## 2022-11-29 MED ORDER — INSULIN DETEMIR 100 UNIT/ML ~~LOC~~ SOLN: 20.0000 [IU] | Freq: Every day | SUBCUTANEOUS | Status: DC

## 2022-11-29 MED ORDER — GLIPIZIDE ER 5 MG PO TB24
5.0000 mg | ORAL_TABLET | Freq: Every day | ORAL | Status: DC
  Administered 2022-11-29 – 2022-12-01 (×3): 5 mg via ORAL
  Filled 2022-11-29 (×3): qty 1

## 2022-11-29 MED ORDER — LINAGLIPTIN 5 MG PO TABS
5.0000 mg | ORAL_TABLET | Freq: Every day | ORAL | Status: DC
  Administered 2022-11-29 – 2022-12-01 (×3): 5 mg via ORAL
  Filled 2022-11-29 (×3): qty 1

## 2022-11-29 MED ORDER — INSULIN GLARGINE-YFGN 100 UNIT/ML ~~LOC~~ SOLN
20.0000 [IU] | Freq: Every day | SUBCUTANEOUS | Status: DC
  Filled 2022-11-29: qty 0.2

## 2022-11-29 NOTE — Progress Notes (Signed)
Arrived to 3W room 3W37 from 4N ICU. Placed on cardiac monitor and vitals obtained. Son, Darryl Arnhold. remains at bedside.

## 2022-11-29 NOTE — Evaluation (Signed)
Occupational Therapy Evaluation Patient Details Name: Lucas Lin MRN: [id number] DOB: 1941/06/24 Today's Date: 11/29/2022   History of Present Illness Patient is an 82 y/o male admitted 11/26/22 with seizures.  PMH positive for DM, HLD, HTN.  Found to have hyperosmolar hyperglycemia syndrome. Also MRI negative for acute abnormality, but noted possible area in L parietal calvarium suspicious for metastasis.   Clinical Impression   Patient admitted for the diagnosis above.  PTA he lives alone, and remained very active, needing no assist for ADL, iADL or mobility.  Patient continues to work part time.  Ankle pain is his primary deficit.  Currently he is needing up to Min A for ADL completion and Min Guard for mobility at a RW level.  OT will follow in the acute setting to address deficits, and assist with transition home.  No post acute OT is anticipated given assist from son.       Recommendations for follow up therapy are one component of a multi-disciplinary discharge planning process, led by the attending physician.  Recommendations may be updated based on patient status, additional functional criteria and insurance authorization.   Assistance Recommended at Discharge Intermittent Supervision/Assistance  Patient can return home with the following Assist for transportation;Assistance with cooking/housework    Functional Status Assessment  Patient has had a recent decline in their functional status and demonstrates the ability to make significant improvements in function in a reasonable and predictable amount of time.  Equipment Recommendations  BSC/3in1    Recommendations for Other Services       Precautions / Restrictions Precautions Precautions: Fall Precaution Comments: seizure Restrictions Weight Bearing Restrictions: No      Mobility Bed Mobility Overal bed mobility: Modified Independent                  Transfers Overall transfer level: Needs  assistance Equipment used: Rolling walker (2 wheels) Transfers: Sit to/from Stand, Bed to chair/wheelchair/BSC Sit to Stand: Min guard     Step pivot transfers: Min guard, Min assist            Balance Overall balance assessment: Needs assistance Sitting-balance support: Feet supported Sitting balance-Leahy Scale: Good     Standing balance support: Reliant on assistive device for balance Standing balance-Leahy Scale: Poor                             ADL either performed or assessed with clinical judgement   ADL       Grooming: Wash/dry hands;Wash/dry face;Set up;Sitting               Lower Body Dressing: Minimal assistance;Sit to/from stand   Toilet Transfer: Min guard;Minimal assistance;Rolling walker (2 wheels);Regular Toilet;Ambulation                   Vision Patient Visual Report: No change from baseline       Perception     Praxis      Pertinent Vitals/Pain Pain Assessment Pain Assessment: Faces Faces Pain Scale: Hurts even more Pain Location: L ankle with weight bearing.  There is an area of swelling Pain Descriptors / Indicators: Tender Pain Intervention(s): Monitored during session     Hand Dominance Right   Extremity/Trunk Assessment Upper Extremity Assessment RUE Deficits / Details: shoulder elevation AROM limited to about 70, other wise WFL with arthritic changes in hands RUE Coordination: WNL LUE Deficits / Details: shoulder elevation AROM limited to about 90, otherwise  WFL with arthritic changes in hands LUE Sensation: WNL LUE Coordination: WNL   Lower Extremity Assessment Lower Extremity Assessment: Defer to PT evaluation   Cervical / Trunk Assessment Cervical / Trunk Assessment: Kyphotic   Communication Communication Communication: HOH   Cognition Arousal/Alertness: Awake/alert Behavior During Therapy: WFL for tasks assessed/performed Overall Cognitive Status: Within Functional Limits for tasks assessed                                  General Comments: son stating he is close to his baseline     General Comments       Exercises     Shoulder Instructions      Home Living Family/patient expects to be discharged to:: Private residence Living Arrangements: Alone Available Help at Discharge: Family;Available 24 hours/day Type of Home: House Home Access: Stairs to enter [employer] of Steps: 3 Entrance Stairs-Rails: Right Home Layout: One level     Bathroom Shower/Tub: Producer, television/film/video: Standard Bathroom Accessibility: Yes How Accessible: Accessible via walker Home Equipment: Grab bars - tub/shower          Prior Functioning/Environment Prior Level of Function : Independent/Modified Independent             Mobility Comments: drives, cooks, 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  ? medication mangement        OT Problem List: Decreased range of motion;Impaired balance (sitting and/or standing);Pain;Increased edema      OT Treatment/Interventions: Self-care/ADL training;Therapeutic activities;Patient/family education;Balance training    OT Goals(Current goals can be found in the care plan section) Acute Rehab OT Goals Patient Stated Goal: Hoping to return home tomorrow OT Goal Formulation: With patient Time For Goal Achievement: 12/13/22 ADL Goals Pt Will Perform Grooming: Independently;standing Pt Will Perform Lower Body Dressing: Independently;sit to/from stand Pt Will Transfer to Toilet: Independently;ambulating;regular height toilet  OT Frequency: Min 2X/week    Co-evaluation              AM-PAC OT "6 Clicks" Daily Activity     Outcome Measure Help from another person eating meals?: None Help from another person taking care of personal grooming?: None Help from another person toileting, which includes using toliet, bedpan, or urinal?: A Little Help from  another person bathing (including washing, rinsing, drying)?: A Little Help from another person to put on and taking off regular upper body clothing?: None Help from another person to put on and taking off regular lower body clothing?: A Little 6 Click Score: 21   End of Session Equipment Utilized During Treatment: Gait belt;Rolling walker (2 wheels) Nurse Communication: Mobility status  Activity Tolerance: Patient tolerated treatment well Patient left: in chair;with call bell/phone within reach  OT Visit Diagnosis: Unsteadiness on feet (R26.81);Pain Pain - Right/Left: Left Pain - part of body: Ankle and joints of foot                Time: 1610-9604 OT Time Calculation (min): 21 min Charges:  OT General Charges $OT Visit: 1 Visit OT Evaluation $OT Eval Moderate Complexity: 1 Mod  11/29/2022  RP, OTR/L  Acute Rehabilitation Services  Office:  [phone]   Suzanna Obey 11/29/2022, 4:28 PM

## 2022-11-29 NOTE — Evaluation (Signed)
Physical Therapy Evaluation Patient Details Name: Lucas Lin MRN: [id number] DOB: 1940/10/05 Today's Date: 11/29/2022  History of Present Illness  Patient is an 82 y/o male admitted 11/26/22 with seizures.  PMH positive for DM, HLD, HTN.  Found to have hyperosmolar hyperglycemia syndrome. Also MRI negative for acute abnormality, but noted possible area in L parietal calvarium suspicious for metastasis.  Clinical Impression  Patient presents with decreased mobility due to L foot pain/cramping and needing RW for ambulation today with minguard A.  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  Feel he will benefit from skilled PT in the acute setting and from follow up HHPT at d/c.  Reports son can stay with him a few days at d/c.  Would recommend initial 24 hour supervision for safety.  PT will continue to follow in acute setting.        Recommendations for follow up therapy are one component of a multi-disciplinary discharge planning process, led by the attending physician.  Recommendations may be updated based on patient status, additional functional criteria and insurance authorization.  Follow Up Recommendations       Assistance Recommended at Discharge Intermittent Supervision/Assistance  Patient can return home with the following  A little help with walking and/or transfers;Assistance with cooking/housework;Assist for transportation;A little help with bathing/dressing/bathroom    Equipment Recommendations Rolling walker (2 wheels)  Recommendations for Other Services       Functional Status Assessment Patient has had a recent decline in their functional status and demonstrates the ability to make significant improvements in function in a reasonable and predictable amount of time.     Precautions / Restrictions Precautions Precautions: Fall Precaution Comments: seizure      Mobility  Bed Mobility               General bed mobility comments: up in  chair    Transfers Overall transfer level: Needs assistance Equipment used: None, Rolling walker (2 wheels) Transfers: Sit to/from Stand Sit to Stand: Supervision           General transfer comment: initially c/o pain L foot so used walker on second trial    Ambulation/Gait Ambulation/Gait assistance: Min guard Gait Distance (Feet): 30 Feet Assistive device: Rolling walker (2 wheels) Gait Pattern/deviations: Decreased stride length, Antalgic, Decreased stance time - left, Wide base of support, Trunk flexed       General Gait Details: limited due to pain L foot in arch, minguard for safety as had not used walker previously  Information systems manager [employer] (Stroke Patients Only)       Balance Overall balance assessment: Needs assistance Sitting-balance support: Feet supported Sitting balance-Leahy Scale: Good     Standing balance support: Single extremity supported, Bilateral upper extremity supported Standing balance-Leahy Scale: Fair Standing balance comment: can stand static without UE support, but needs RW for ambulation                             Pertinent Vitals/Pain Pain Assessment Pain Assessment: Faces Faces Pain Scale: Hurts even more Pain Location: L foot at arch with ambulation Pain Descriptors / Indicators: Cramping Pain Intervention(s): Monitored during session, Limited activity within patient's tolerance    Home Living Family/patient expects to be discharged to:: Private residence Living Arrangements: Alone Available Help at Discharge: Family;Available 24 hours/day (son can stay with him  initially) Type of Home: House Home Access: Stairs to enter Entrance Stairs-Rails: Right Entrance Stairs-Number of Steps: 3   Home Layout: One level Home Equipment: Grab bars - tub/shower      Prior Function Prior Level of Function : Independent/Modified Independent             Mobility Comments: drives,  cooks, 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 [employer]   Dominant Hand: Right    Extremity/Trunk Assessment   Upper Extremity Assessment Upper Extremity Assessment: RUE deficits/detail;LUE deficits/detail RUE Deficits / Details: shoulder elevation AROM limited to about 70, other wise WFL with arthritic changes in hands LUE Deficits / Details: shoulder elevation AROM limited to about 90, otherwise WFL with arthritic changes in hands    Lower Extremity Assessment Lower Extremity Assessment: Overall WFL for tasks assessed    Cervical / Trunk Assessment Cervical / Trunk Assessment: Kyphotic  Communication   Communication: HOH  Cognition Arousal/Alertness: Awake/alert Behavior During Therapy: WFL for tasks assessed/performed Overall Cognitive Status: No family/caregiver present to determine baseline cognitive functioning                                 General Comments: oriented to place and situation, not to year, day        General Comments General comments (skin integrity, edema, etc.): VSS on RA; toileted in bathroom cues for technique with walker; S level for balance/safety    Exercises     Assessment/Plan    PT Assessment Patient needs continued PT services  PT Problem List Decreased strength;Decreased balance;Decreased knowledge of precautions;Decreased mobility;Decreased knowledge of use of DME;Decreased activity tolerance       PT Treatment Interventions DME instruction;Functional mobility training;Balance training;Patient/family education;Therapeutic activities;Gait training;Stair training;Therapeutic exercise;Neuromuscular re-education    PT Goals (Current goals can be found in the Care Plan section)  Acute Rehab PT Goals Patient Stated Goal: return to independent PT Goal Formulation: With patient Time For Goal Achievement: 12/13/22 Potential to Achieve Goals: Good    Frequency Min 3X/week     Co-evaluation                AM-PAC PT "6 Clicks" Mobility  Outcome Measure Help needed turning from your back to your side while in a flat bed without using bedrails?: None Help needed moving from lying on your back to sitting on the side of a flat bed without using bedrails?: A Little Help needed moving to and from a bed to a chair (including a wheelchair)?: A Little Help needed standing up from a chair using your arms (e.g., wheelchair or bedside chair)?: None Help needed to walk in hospital room?: A Little Help needed climbing 3-5 steps with a railing? : Total 6 Click Score: 18    End of Session   Activity Tolerance: Patient tolerated treatment well Patient left: in chair;with chair alarm set;with call bell/phone within reach   PT Visit Diagnosis: Other abnormalities of gait and mobility (R26.89);Other symptoms and signs involving the nervous system (R29.898)    Time: 1610-9604 PT Time Calculation (min) (ACUTE ONLY): 28 min   Charges:   PT Evaluation $PT Eval Low Complexity: 1 Low PT Treatments $Gait Training: 8-22 mins        Sheran Lawless, PT Acute Rehabilitation Services [phone] 11/29/2022   Elray Mcgregor 11/29/2022, 12:19 PM

## 2022-11-29 NOTE — Progress Notes (Signed)
PROGRESS NOTE  Lucas Lin  DOB: Jul 01, 1941  PCP: Clovis Riley, [name], MD ZOX:[id number]  DOA: 11/26/2022  LOS: 3 days  Hospital Day: 4  Brief narrative: Lucas Lin is a 82 y.o. male with PMH significant for DM2, HTN, HLD and h/o HHS with seizures 2010. 5/4, patient was found down having a tonic-clonic seizure.  Last known normal about 2 hours prior.  Per report, he was complaining of right upper extremity twitching over the last few days. Upon EMS arrival, patient was minimally interactive and had vomited. Patient was given versed and transferred to [facility] ED.  In the ED, patient was afebrile, hemodynamically stable.  Loaded with Keppra  Neurology consulted  CT head unremarkable  MRI brain unremarkable for acute intracranial abnormality but showed a nonspecific lucency in the left parietal calvarium suspicious for metastasis.  Initial labs with CBC unremarkable, lactic acid elevated to 3.9, sodium level low at 119, potassium elevated at 5.7, glucose 1100, BUN/creatinine 79/4.24, anion gap elevated to 20 VBG with pH low at 7.3, beta-hydroxybutyrate level elevated in blood but UA without ketones. Patient was started on IV fluid, IV insulin drip. PCCM consulted and admitted to ICU 5/6, transferred out to [facility]  Subjective: Patient was seen and examined this morning.  Sitting up in recliner.  Not in distress. Hemodynamically stable.  Appetite not great  Assessment and plan: Status epilepticus in the setting of hyperglycemia Acute metabolic encephalopathy CT head and MRI brain did not show any intracranial normality MRI brain showed left parietal calvarium lesion, likely metastatic disease While in ICU, patient had prolonged seizures for multiple days. Neurology was consulted EEG was obtained which did not show any epileptiform discharges throughout the duartion of recording. Per neurology recommendation 5/5, to continue Keppra 500 mg twice daily for few months.  To follow-up  with neurology as an outpatient   Diabetic ketoacidosis  Hyperosmolar hyperglycemia syndrome On admission, patient had blood sugar elevated over 1000, serum ketone level elevated, anion gap elevated, acidotic pH 7.3.  He met criteria for both DKA and HHS Given aggressive IV hydration. Initially treated with insulin drip and later switched to subcu insulin.  Type 2 diabetes mellitus uncontrolled A1c 12.9 on 11/26/2022 PTA not on meds Currently on Levemir 20 units twice daily with SSI/Accu-Cheks.  Blood sugar level running lower than expected.  Changed to Semglee 20 units daily.  Also added glipizide and Tradjenta.   Recent Labs  Lab 11/28/22 1519 11/28/22 1919 11/28/22 2221 11/29/22 0816 11/29/22 1136  GLUCAP 288* 254* 219* 95 91   AKI Presented with creatinine elevated 4.2.  Unclear baseline because prior to this presentation, patient did not have labs for several years.  Gradually improving creatinine.  Continue normal saline at a reduced rate of 50 mill per hour Recent Labs    11/26/22 1828 11/26/22 1841 11/26/22 2104 11/27/22 0057 11/27/22 0607 11/27/22 0956 11/27/22 1405 11/29/22 0354  BUN 79* 76* 83* 74* 70* 65* 63* 33*  CREATININE 4.24* 4.50* 3.86* 3.54* 3.01* 2.80* 2.48* 1.42*   Pseudohyponatremia Initial major sodium level was significantly low at 119 but corrected sodium level was better at 135. Sodium level showed improvement with correction of blood sugar level started on running in last 48 hours.  Continue IV fluid for today.  Encourage oral intake.   Recent Labs  Lab 11/26/22 1828 11/26/22 1841 11/26/22 1842 11/26/22 2104 11/27/22 0057 11/27/22 0607 11/27/22 0956 11/27/22 1405 11/29/22 0354  NA 119* 121* 120* 121* 131* 133* 136 134* 132*  HTN Blood pressure elevated to 140s and 160s in the last 24 hours. PTA on amlodipine, Lasix, HCTZ, losartan.  Unsure of compliance Blood pressure is normal on amlodipine 5 mg daily. IV hydralazine as needed  continue  HLD Obtain lipid panel  Left-sided skull lesion noted on MRI Needs outpatient workup for malignancy    Mobility: PT eval obtained.  Home with PT recommended  Goals of care   Code Status: Full Code     DVT prophylaxis:  heparin injection 5,000 Units Start: 11/26/22 2130   Antimicrobials: None currently Fluid: NS at 50 mill per hour Consultants: Neurology, PCCM Family Communication: None at bedside  Status: Inpatient Level of care:  Progressive   Patient from: Home Anticipated d/c to: Pending clinical course Needs to continue in-hospital care:  Creatinine improving.  Hopefully home tomorrow   Diet:  Diet Order             Diet Carb Modified Fluid consistency: Thin; Room service appropriate? Yes with Assist  Diet effective now                   Scheduled Meds:  amLODipine  5 mg Oral Daily   Chlorhexidine Gluconate Cloth  6 each Topical Daily   glipiZIDE  5 mg Oral Q breakfast   heparin injection (subcutaneous)  5,000 Units Subcutaneous Q8H   insulin aspart  0-5 Units Subcutaneous QHS   insulin aspart  0-9 Units Subcutaneous TID WC   [START ON 11/30/2022] insulin glargine-yfgn  20 Units Subcutaneous Daily   levETIRAcetam  500 mg Oral Q12H   linagliptin  5 mg Oral Daily    PRN meds: acetaminophen **OR** acetaminophen, dextrose   Infusions:   sodium chloride 50 mL/hr at 11/29/22 1343   levETIRAcetam Stopped (11/29/22 0145)    Antimicrobials: Anti-infectives (From admission, onward)    None       Nutritional status:  Body mass index is 25.41 kg/m.          Objective: Vitals:   11/29/22 1200 11/29/22 1300  BP:  125/63  Pulse: 85   Resp: 18 16  Temp: 97.9 F (36.6 C)   SpO2: 97%     Intake/Output Summary (Last 24 hours) at 11/29/2022 1402 Last data filed at 11/29/2022 1300 Gross per 24 hour  Intake 1783.11 ml  Output 1975 ml  Net -191.89 ml   Filed Weights   11/26/22 1838  Weight: 85 kg   Weight change:  Body mass  index is 25.41 kg/m.   Physical Exam: General exam: Pleasant, elderly African-American male.  Not in pain Skin: No rashes, lesions or ulcers. HEENT: Atraumatic, normocephalic, no obvious bleeding Lungs: Clear to auscultation bilaterally CVS: Regular rate and rhythm, no murmur GI/Abd soft, nontender, nondistended, bowel sound present CNS: Alert, awake, oriented x 3.   Psychiatry: Mood appropriate Extremities: No pedal edema, no calf tenderness  Data Review: I have personally reviewed the laboratory data and studies available.  F/u labs ordered Unresulted Labs (From admission, onward)     Start     Ordered   11/30/22 0500  CBC with Differential/Platelet  Tomorrow morning,   R       Question:  Specimen collection method  Answer:  Lab=Lab collect   11/29/22 1402   11/30/22 0500  Basic metabolic panel  Tomorrow morning,   R       Question:  Specimen collection method  Answer:  Lab=Lab collect   11/29/22 1402   11/30/22 0500  Lipid panel  Tomorrow morning,   R       Question:  Specimen collection method  Answer:  Lab=Lab collect   11/29/22 1402            Total time spent in review of labs and imaging, patient evaluation, formulation of plan, documentation and communication with family: 45 minutes  Signed, Lorin Glass, MD [facility] 11/29/2022

## 2022-11-30 ENCOUNTER — Encounter (HOSPITAL_COMMUNITY): Payer: Self-pay | Admitting: Pulmonary Disease

## 2022-11-30 ENCOUNTER — Other Ambulatory Visit (HOSPITAL_COMMUNITY): Payer: Self-pay

## 2022-11-30 ENCOUNTER — Ambulatory Visit: Payer: 59 | Admitting: Surgical

## 2022-11-30 DIAGNOSIS — G40901 Epilepsy, unspecified, not intractable, with status epilepticus: Secondary | ICD-10-CM | POA: Diagnosis not present

## 2022-11-30 LAB — BASIC METABOLIC PANEL
Anion gap: 13 (ref 5–15)
BUN: 35 mg/dL — ABNORMAL HIGH (ref 8–23)
CO2: 20 mmol/L — ABNORMAL LOW (ref 22–32)
Calcium: 9.1 mg/dL (ref 8.9–10.3)
Chloride: 98 mmol/L (ref 98–111)
Creatinine, Ser: 1.69 mg/dL — ABNORMAL HIGH (ref 0.61–1.24)
GFR, Estimated: 40 mL/min — ABNORMAL LOW (ref >60)
Glucose, Bld: 207 mg/dL — ABNORMAL HIGH (ref 70–99)
Potassium: 4 mmol/L (ref 3.5–5.1)
Sodium: 131 mmol/L — ABNORMAL LOW (ref 135–145)

## 2022-11-30 LAB — GLUCOSE, CAPILLARY
Glucose-Capillary: 182 mg/dL — ABNORMAL HIGH (ref 70–99)
Glucose-Capillary: 132 mg/dL — ABNORMAL HIGH (ref 70–99)
Glucose-Capillary: 258 mg/dL — ABNORMAL HIGH (ref 70–99)
Glucose-Capillary: 397 mg/dL — ABNORMAL HIGH (ref 70–99)
Glucose-Capillary: 339 mg/dL — ABNORMAL HIGH (ref 70–99)

## 2022-11-30 LAB — SYNOVIAL CELL COUNT + DIFF, W/ CRYSTALS
Eosinophils-Synovial: 0 % (ref 0–1)
Lymphocytes-Synovial Fld: 0 % (ref 0–20)
Monocyte-Macrophage-Synovial Fluid: 19 % — ABNORMAL LOW (ref 50–90)
Neutrophil, Synovial: 81 % — ABNORMAL HIGH (ref 0–25)
WBC, Synovial: 32750 /mm3 — ABNORMAL HIGH (ref 0–200)

## 2022-11-30 LAB — CBC WITH DIFFERENTIAL/PLATELET
Abs Immature Granulocytes: 0.02 10*3/uL (ref 0.00–0.07)
Basophils Absolute: 0 10*3/uL (ref 0.0–0.1)
Basophils Relative: 1 %
Eosinophils Absolute: 0.2 10*3/uL (ref 0.0–0.5)
Eosinophils Relative: 2 %
HCT: 36.5 % — ABNORMAL LOW (ref 39.0–52.0)
Hemoglobin: 12.4 g/dL — ABNORMAL LOW (ref 13.0–17.0)
Immature Granulocytes: 0 %
Lymphocytes Relative: 19 %
Lymphs Abs: 1.6 10*3/uL (ref 0.7–4.0)
MCH: 28.8 pg (ref 26.0–34.0)
MCHC: 34 g/dL (ref 30.0–36.0)
MCV: 84.9 fL (ref 80.0–100.0)
Monocytes Absolute: 0.8 10*3/uL (ref 0.1–1.0)
Monocytes Relative: 10 %
Neutro Abs: 6 10*3/uL (ref 1.7–7.7)
Neutrophils Relative %: 68 %
Platelets: 208 10*3/uL (ref 150–400)
RBC: 4.3 MIL/uL (ref 4.22–5.81)
RDW: 14.6 % (ref 11.5–15.5)
WBC: 8.7 10*3/uL (ref 4.0–10.5)
nRBC: 0 % (ref 0.0–0.2)

## 2022-11-30 LAB — LIPID PANEL
Cholesterol: 147 mg/dL (ref 0–200)
HDL: 47 mg/dL (ref >40)
LDL Cholesterol: 85 mg/dL (ref 0–99)
Total CHOL/HDL Ratio: 3.1 RATIO
Triglycerides: 77 mg/dL (ref <150)
VLDL: 15 mg/dL (ref 0–40)

## 2022-11-30 LAB — BODY FLUID CULTURE W GRAM STAIN

## 2022-11-30 MED ORDER — SENNOSIDES-DOCUSATE SODIUM 8.6-50 MG PO TABS
1.0000 | ORAL_TABLET | Freq: Every evening | ORAL | Status: DC | PRN
  Administered 2022-12-01: 1 via ORAL
  Filled 2022-11-30: qty 1

## 2022-11-30 MED ORDER — HYDROMORPHONE HCL 1 MG/ML IJ SOLN: 0.5000 mg | INTRAMUSCULAR | Status: DC | PRN

## 2022-11-30 MED ORDER — ACETAMINOPHEN 500 MG PO TABS
1000.0000 mg | ORAL_TABLET | Freq: Three times a day (TID) | ORAL | Status: DC
  Administered 2022-11-30 – 2022-12-01 (×3): 1000 mg via ORAL
  Filled 2022-11-30 (×4): qty 2

## 2022-11-30 MED ORDER — ACETAMINOPHEN 325 MG PO TABS
650.0000 mg | ORAL_TABLET | ORAL | Status: DC | PRN
  Administered 2022-11-30 (×2): 650 mg via ORAL
  Filled 2022-11-30: qty 2

## 2022-11-30 MED ORDER — ACETAMINOPHEN 650 MG RE SUPP: 650.0000 mg | RECTAL | Status: DC | PRN

## 2022-11-30 MED ORDER — LIVING WELL WITH DIABETES BOOK
Freq: Once | Status: AC
  Filled 2022-11-30: qty 1

## 2022-11-30 MED ORDER — OXYCODONE HCL 5 MG PO TABS: 5.0000 mg | ORAL_TABLET | Freq: Four times a day (QID) | ORAL | Status: DC | PRN

## 2022-11-30 MED ORDER — INSULIN GLARGINE-YFGN 100 UNIT/ML ~~LOC~~ SOLN
25.0000 [IU] | Freq: Every day | SUBCUTANEOUS | Status: DC
  Administered 2022-11-30 – 2022-12-01 (×2): 25 [IU] via SUBCUTANEOUS
  Filled 2022-11-30 (×2): qty 0.25

## 2022-11-30 MED ORDER — INSULIN STARTER KIT- PEN NEEDLES (ENGLISH)
1.0000 | Freq: Once | Status: AC
  Administered 2022-11-30: 1
  Filled 2022-11-30: qty 1

## 2022-11-30 NOTE — Inpatient Diabetes Management (Signed)
Inpatient Diabetes Program Recommendations  AACE/ADA: New Consensus Statement on Inpatient Glycemic Control (2015)  Target Ranges:  Prepandial:   less than 140 mg/dL      Peak postprandial:   less than 180 mg/dL (1-2 hours)      Critically ill patients:  140 - 180 mg/dL   Lab Results  Component Value Date   GLUCAP 258 (H) 11/30/2022   HGBA1C 12.9 (H) 11/26/2022    Review of Glycemic Control  Latest Reference Range & Units 11/30/22 06:30 11/30/22 08:26 11/30/22 12:09  Glucose-Capillary 70 - 99 mg/dL 409 (H) 811 (H) 914 (H)  (H): Data is abnormally high  Diabetes history: DM2 Outpatient Diabetes medications: Metformin QD (unsure of dose), Jardiance 10 mg QD Current orders for Inpatient glycemic control: Semglee 25 units QD,  Novolog 0-9 units TID and 0-5 units QHS, Glipizide 5 mg QD, Tradjenta 5 mg QD  Spoke with patient at bedside.  He states he takes Metformin at home and has been off of insulin for several months.  He is current with Lupe Carney at [place] [facility] for PCP.  He states he remembers how to administer insulin using the insulin pen.  He shows me where to administer and he rotates sites.  He has not checked his BG since insulin was discontinued.  Reviewed patient's current A1c of 12.9% (average BG of 324 mg/dL). Explained what a A1c is and what it measures. Also reviewed goal A1c with patient, importance of good glucose control @ home, and blood sugar goals.  Ordered the LWWD booklet and insulin starter kit.  Benefit check completed-Lantuss and Humalog are preferred at $0 co-pay.  He will need a glucometer at DC.  We discussed hypoglycemia, signs, symptoms and treatments.    Called and spoke with Patients son, Mr. Cockburn.  He states his mother used to take him to his appointments but she has passed.  He is now the contact person and will be accompanying him to his appointments.  Son states his PCP took him off of insulin around 1 year ago because his BG's were good.   Explained he will need insulin moving forward and discussed A1C results with him.  Educated on hypoglycemia, sign, symptoms and treatment.  He has diabetes as well but is not on insulin.    Will see come by again tomorrow morning and speak with patient and son at bedside and reinforce education/information.     Will continue to follow while inpatient.  Thank you, Dulce Sellar, MSN, CDCES Diabetes Coordinator Inpatient Diabetes Program [phone] (team pager from 8a-5p)

## 2022-11-30 NOTE — Progress Notes (Signed)
    Durable Medical Equipment  (From admission, onward)           Start     Ordered   11/30/22 1006  For home use only DME Walker rolling  Once       Question Answer Comment  Walker: With 5 Inch Wheels   Patient needs a walker to treat with the following condition Weakness      11/30/22 1005   11/30/22 1006  For home use only DME Bedside commode  Once       Question:  Patient needs a bedside commode to treat with the following condition  Answer:  Weakness   11/30/22 1005           Pt requires a bedside commode as he has difficulty making it to the restroom in a timely manner on the level of the home in which he will be staying.

## 2022-11-30 NOTE — TOC Transition Note (Signed)
Transition of Care [facility]) - CM/SW Discharge Note   Patient Details  Name: Lucas Lin MRN: [id number] Date of Birth: 07-01-1941  Transition of Care [facility]) CM/SW Contact:  Kermit Balo, RN Phone Number: 11/30/2022, 10:09 AM   Clinical Narrative:    Pt is from home alone. He says his son lives in [place] and can check on him periodically.  No DME at home.  Pt drives self and manages his own medications at home . He denies any issues. He uses Development worker, community. Will have TOC pharmacy deliver medications to the room at d/c.  Pt drives self and still works 5-6 days a week. He wont qualify for [facility] services working outside the home. He is in agreement with outpatient therapy. Referral sent and information on the AVS. Walker and BSC ordered through [employer] and will be delivered to the room. Son will provide transportation home.   Final next level of care: OP Rehab Barriers to Discharge: No Barriers Identified   Patient Goals and CMS Choice      Discharge Placement                         Discharge Plan and Services Additional resources added to the After Visit Summary for                  DME Arranged: Bedside commode, Walker rolling DME Agency: [employer] Date DME Agency Contacted: 11/30/22 Time DME Agency Contacted: 1009 Representative spoke with at DME Agency: Barbara Cower            Social Determinants of Health (SDOH) Interventions SDOH Screenings   Food Insecurity: No Food Insecurity (11/29/2022)  Housing: Low Risk  (11/29/2022)  Transportation Needs: No Transportation Needs (11/29/2022)  Utilities: Not At Risk (11/29/2022)  Tobacco Use: Medium Risk (11/29/2022)     Readmission Risk Interventions     No data to display

## 2022-11-30 NOTE — Progress Notes (Addendum)
Chaplain responded to a consult request for Advance Directive education. Lucas Goetter was sitting up in his chair eating lunch at the time of my visit. He shared that he is feeling much better today.  Chaplain provided the Advance Directive packet as well as education on Advance Directives-documents an individual completes to communicate their health care directions in advance of a time when they may need them. Chaplain informed pt the documents which may be completed here in the hospital are the Living Will and Health Care Power of [place].   Chaplain informed that the Health Care Power of [place] is a legal document in which an individual names another person, their Health Care Agent, to make health care decisions when the individual is not able to make them for themselves. The Health Care Agent's function can be temporary or permanent depending on the pt's ability to make and communicate those decisions independently. Chaplain informed pt in the absence of a Health Care Power of [place], the state of [place] directs health care providers to look to the following individuals in the order listed: legal guardian; an attorney?in?fact under a general power of attorney (POA) if that POA includes the right to make health care decisions; a husband or wife; a majority of parents and adult children; a majority of adult brothers and sisters; or an individual who has an established relationship with you, who is acting in good faith and who can convey your wishes.  If none of these person are available or willing to make medical decisions on a patient's behalf, the law allows the patient's doctor to make decisions for them as long as another doctor agrees with those decisions.  Chaplain also informed the patient that the Health Care agent has no decision-making authority over any affairs other than those related to his or her medical care.   The chaplain further educated the pt that a Living Will is a legal  document that allows an individual to state his or her desire not to receive life-prolonging measures in the event that they have a condition that is incurable and will result in their death in a short period of time; they are unconscious, and doctors are confident that they will not regain consciousness; and/or they have advanced dementia or other substantial and irreversible loss of mental function. The chaplain informed pt that life-prolonging measures are medical treatments that would only serve to postpone death, including breathing machines, kidney dialysis, antibiotics, artificial nutrition and hydration (tube feeding), and similar forms of treatment and that if an individual is able to express their wishes, they may also make them known without the use of a Living Will, but in the event that an individual is not able to express their wishes themselves, a Living Will allows medical providers and the pt's family and friends ensure that they are not making decisions on the pt's behalf, but rather serving as the pt's voice to convey decisions the pt has already made.   The patient is aware that the decision to create an advance directive is theirs alone and they may chose not to complete the documents or may chose to complete one portion or both.  The patient was informed that they can revoke the documents at any time by striking through them and writing void or by completing new documents, but that it is also advisable that the individual verbally notify interested parties that their wishes have changed.  They are also aware that the document must be signed in the  presence of a notary public and two witnesses and that this can be done while the patient is still admitted to the hospital or after discharge in the community. If they decide to complete Advance Directives after being discharged from the hospital, they have been advised to notify all interested parties and to provide those documents to their  physicians and loved ones in addition to bringing them to the hospital in the event of another hospitalization.   The chaplain informed the pt that if they desire to proceed with completing Advance Directive Documentation while they are still admitted, notary services are typically available at [facility] between the hours of 1:00 and 3:30 Monday-Thursday.    Lucas Lin indicated his son cares for him and he'd like to review these documents with him.  When the patient is ready to have these documents completed, the patient should request that their nurse place a spiritual care consult and indicate that the patient is ready to have their advance directives notarized so that arrangements for witnesses and notary public can be made.  Please page spiritual care if the patient desires further education or has questions.  Maryanna Shape. Carley Hammed, M.Div. [facility] Chaplain Pager [phone] Office [phone]           11/30/22 1332  Spiritual Encounters  Type of Visit Initial  Care provided to: Patient  Referral source Nurse (RN/NT/LPN)  Reason for visit Advance directives  OnCall Visit No  Advance Directives (For Healthcare)  Does Patient Have a Medical Advance Directive? No  Would patient like information on creating a medical advance directive? Yes (Inpatient - patient defers creating a medical advance directive at this time - Information given)

## 2022-11-30 NOTE — TOC Benefit Eligibility Note (Signed)
Patient Product/process development scientist completed.    The patient is currently admitted and upon discharge could be taking Lantus Pen.  The current 30 day co-pay is $0.00.   The patient is currently admitted and upon discharge could be taking Humalog KwikPen.  The current 30 day co-pay is $0.00.   The patient is insured through [employer]   This test claim was processed through [employer]- copay amounts may vary at other pharmacies due to [employer], or as the patient moves through the different stages of their insurance plan.  Roland Earl, CPHT Pharmacy Patient Advocate Specialist [facility] Patient Advocate Team Direct Number: [phone]  Fax: [phone]

## 2022-11-30 NOTE — Progress Notes (Signed)
Review of fluid analysis shows 32,000 cell count with 80% neutrophils.  Also shows gout crystals which seems most consistent with his symptoms.  He did have injection of Marcaine and 0.5 cc Toradol after aspiration that provided good relief of his ankle pain upon reexamination 10 minutes later.  No need for surgical intervention at this time.  Gram stain shows no organisms.  He may follow-up with Dr. August Saucer in clinic for evaluation of his shoulder and ankle pain if he still continues to have pain after his discharge from hospital.

## 2022-11-30 NOTE — Progress Notes (Signed)
Physical Therapy Treatment Patient Details Name: Lucas Lin MRN: [id number] DOB: 1941/03/14 Today's Date: 11/30/2022   History of Present Illness Patient is an 82 y/o male admitted 11/26/22 with seizures.  PMH positive for DM, HLD, HTN.  Found to have hyperosmolar hyperglycemia syndrome. Also MRI negative for acute abnormality, but noted possible area in L parietal calvarium suspicious for metastasis.    PT Comments    Pt was able to progress to ambulating further this date despite his continued L ankle pain. He was able to ambulate x2 bouts of ~115-125 ft each with a RW at a min guard assist level. He needs reminders to look superiorly, stand upright, RW proximity, and push up through the RW to reduce weight during L stance phase as needed for pain management. Attempted to utilize a Scnetx but pt is still reliant on bil UE support due to his L ankle pain. Educated pt on navigating stairs leading up with his less painful foot and leading down with his more painful foot as needed. Pt demonstrated good understanding, needing min guard for safety ascending but minA for stability descending stairs today. Will continue to follow acutely.    Recommendations for follow up therapy are one component of a multi-disciplinary discharge planning process, led by the attending physician.  Recommendations may be updated based on patient status, additional functional criteria and insurance authorization.  Follow Up Recommendations       Assistance Recommended at Discharge Intermittent Supervision/Assistance  Patient can return home with the following A little help with walking and/or transfers;Assistance with cooking/housework;Assist for transportation;A little help with bathing/dressing/bathroom;Help with stairs or ramp for entrance   Equipment Recommendations  Rolling walker (2 wheels)    Recommendations for Other Services       Precautions / Restrictions Precautions Precautions: Fall Precaution  Comments: seizure Restrictions Weight Bearing Restrictions: No     Mobility  Bed Mobility Overal bed mobility: Needs Assistance Bed Mobility: Supine to Sit     Supine to sit: Supervision, HOB elevated     General bed mobility comments: Supervision for safety, HOB elevated    Transfers Overall transfer level: Needs assistance Equipment used: Rolling walker (2 wheels), Straight cane Transfers: Sit to/from Stand Sit to Stand: Min guard           General transfer comment: Attempted SPC in R hand with first rep, but pt unable to take but x2 steps with [facility] support on R and L hand on desk for support due to L ankle pain. Transitioned pt to a RW for improved stability and to reduce L ankle weightbearing as needed for pain management when ambulating. Min guard for safety with transfers.    Ambulation/Gait Ambulation/Gait assistance: Min guard Gait Distance (Feet): 125 Feet (x2 bouts of ~115 ft > ~125 ft) Assistive device: Rolling walker (2 wheels), Straight cane Gait Pattern/deviations: Decreased stride length, Antalgic, Decreased stance time - left, Trunk flexed, Step-through pattern Gait velocity: reduced Gait velocity interpretation: <1.31 ft/sec, indicative of household ambulator   General Gait Details: Attempted SPC in R hand with first rep, but pt unable to take but x2 steps with [facility] support on R and L hand on desk for support due to L ankle pain. Transitioned pt to a RW for improved stability and to reduce L ankle weightbearing as needed for pain management when ambulating, cuing pt to keep RW proximal and stand upright intermittently while ambulating. Pt displays excess R lateral trunk flexion and decreased L stance time, likely due to  L ankle pain as well. No LOB, min guard for safety   Stairs Stairs: Yes Stairs assistance: Min guard, Min assist Stair Management: One rail Right, One rail Left, Step to pattern, Forwards Number of Stairs: 2 General stair comments: Ascends  with bil hands on R rail, step-to pattern, leading up with his R foot as cued, min guard assist for safety. Descends with bil hands on the L rail, step-to pattern, leading down with his L leg as cued, needing minA for balance and cues to step more proximal to edge with R leg before descending   Wheelchair Mobility    Modified Rankin (Stroke Patients Only)       Balance Overall balance assessment: Needs assistance Sitting-balance support: No upper extremity supported, Feet supported Sitting balance-Leahy Scale: Good     Standing balance support: Bilateral upper extremity supported, During functional activity, Reliant on assistive device for balance Standing balance-Leahy Scale: Poor Standing balance comment: Reliant on RW                            Cognition Arousal/Alertness: Awake/alert Behavior During Therapy: WFL for tasks assessed/performed Overall Cognitive Status: Within Functional Limits for tasks assessed                                 General Comments: Pt slow to process cues at times, but is [facility]        Exercises      General Comments        Pertinent Vitals/Pain Pain Assessment Pain Assessment: Faces Faces Pain Scale: Hurts even more Pain Location: L ankle with weight bearing Pain Descriptors / Indicators: Discomfort, Grimacing, Guarding, Moaning Pain Intervention(s): Monitored during session, Limited activity within patient's tolerance, Repositioned    Home Living                          Prior Function            PT Goals (current goals can now be found in the care plan section) Acute Rehab PT Goals Patient Stated Goal: return to independent PT Goal Formulation: With patient Time For Goal Achievement: 12/13/22 Potential to Achieve Goals: Good Progress towards PT goals: Progressing toward goals    Frequency    Min 3X/week      PT Plan Current plan remains appropriate    Co-evaluation               AM-PAC PT "6 Clicks" Mobility   Outcome Measure  Help needed turning from your back to your side while in a flat bed without using bedrails?: None Help needed moving from lying on your back to sitting on the side of a flat bed without using bedrails?: A Little Help needed moving to and from a bed to a chair (including a wheelchair)?: A Little Help needed standing up from a chair using your arms (e.g., wheelchair or bedside chair)?: A Little Help needed to walk in hospital room?: A Little Help needed climbing 3-5 steps with a railing? : A Little 6 Click Score: 19    End of Session Equipment Utilized During Treatment: Gait belt Activity Tolerance: Patient tolerated treatment well Patient left: in chair;with call bell/phone within reach;with chair alarm set   PT Visit Diagnosis: Other abnormalities of gait and mobility (R26.89);Other symptoms and signs involving the nervous system (R29.898);Unsteadiness on feet (  R26.81);Difficulty in walking, not elsewhere classified (R26.2);Pain Pain - Right/Left: Left Pain - part of body: Ankle and joints of foot     Time: 1610-9604 PT Time Calculation (min) (ACUTE ONLY): 25 min  Charges:  $Gait Training: 23-37 mins                     Raymond Gurney, PT, DPT Acute Rehabilitation Services  Office: [phone]    Jewel Baize 11/30/2022, 1:39 PM

## 2022-11-30 NOTE — Progress Notes (Signed)
PROGRESS NOTE  Lucas Lin  DOB: 09-20-1940  PCP: Clovis Riley, [name], MD ZOX:[id number]  DOA: 11/26/2022  LOS: 4 days  Hospital Day: 5  Brief narrative: Lucas Lin is a 82 y.o. male with PMH significant for DM2, HTN, HLD and h/o HHS with seizures 2010. 5/4, patient was found down having a tonic-clonic seizure.  Last known normal about 2 hours prior.  Per report, he was complaining of right upper extremity twitching over the last few days. Upon EMS arrival, patient was minimally interactive and had vomited. Patient was given versed and transferred to [facility] ED.  In the ED, patient was afebrile, hemodynamically stable.  Loaded with Keppra  Neurology consulted  CT head unremarkable  MRI brain unremarkable for acute intracranial abnormality but showed a nonspecific lucency in the left parietal calvarium suspicious for metastasis.  Initial labs with CBC unremarkable, lactic acid elevated to 3.9, sodium level low at 119, potassium elevated at 5.7, glucose 1100, BUN/creatinine 79/4.24, anion gap elevated to 20 VBG with pH low at 7.3, beta-hydroxybutyrate level elevated in blood but UA without ketones. Patient was started on IV fluid, IV insulin drip. PCCM consulted and admitted to ICU 5/6, transferred [facility] to [facility]  Subjective: Patient was seen and examined this morning.  Sitting up at the edge of the bed.  Taking his breakfast.   While working with OT yesterday, he mentioned left ankle pain that significantly limited his mobility.  Assessment and plan: Status epilepticus in the setting of hyperglycemia Acute metabolic encephalopathy CT head and MRI brain did not show any intracranial normality MRI brain showed left parietal calvarium lesion, likely metastatic disease While in ICU, patient had prolonged seizures for multiple days. Neurology was consulted EEG was obtained which did not show any epileptiform discharges throughout the duartion of recording. Per neurology  recommendation 5/5, to continue Keppra 500 mg twice daily for few months.  To follow-up with neurology as an outpatient   Diabetic ketoacidosis  Hyperosmolar hyperglycemia syndrome On admission, patient had blood sugar elevated over 1000, serum ketone level elevated, anion gap elevated, acidotic pH 7.3.  He met criteria for both DKA and HHS Given aggressive IV hydration. Initially treated with insulin drip and later switched to subcu insulin.  Type 2 diabetes mellitus uncontrolled A1c 12.9 on 11/26/2022 PTA not on meds Currently on Semglee 20 units daily.  Blood sugar level running elevated.  I increased Semglee to 25 units daily today.  Also continue glipizide and Tradjenta.   Recent Labs  Lab 11/29/22 1136 11/29/22 1618 11/29/22 2131 11/30/22 0630 11/30/22 0826  GLUCAP 91 201* 317* 182* 132*    AKI Presented with creatinine elevated 4.2.  Unclear baseline because prior to this presentation, patient did not have labs for several years.  Gradually improving creatinine with IV fluid, but noted worsening last 24 hours.  Will continue normal saline at an increased rate of 75 mill per hour. Recent Labs    11/26/22 1828 11/26/22 1841 11/26/22 2104 11/27/22 0057 11/27/22 8119 11/27/22 0956 11/27/22 1405 11/29/22 0354 11/30/22 0339  BUN 79* 76* 83* 74* 70* 65* 63* 33* 35*  CREATININE 4.24* 4.50* 3.86* 3.54* 3.01* 2.80* 2.48* 1.42* 1.69*    Left ankle pain Generalized arthritis Patient states he has generalized osteoarthritis and follows up with Dr. August Saucer as an outpatient.  In the past, he has required shots in his knee and shoulder. While working with OT yesterday, he mentioned left ankle pain that significantly limited his mobility.  On exam, he has  swelling and tenderness in the left ankle x-ray was obtained which showed mild osteoarthritis and moderate plantar calcaneal spur and Achilles tendon enthesophyte. D/w Karenann Cai.  Patient may need arthrocentesis. For now, continue  pain management with scheduled Tylenol, as needed oxycodone and as needed Dilaudid  Pseudohyponatremia Initial major sodium level was significantly low at 119 but corrected sodium level was better at 135. Sodium level showed improvement with correction of blood sugar level started on running in last 48 hours.  Continue IV fluid for today.  Encourage oral intake.   Recent Labs  Lab 11/26/22 1828 11/26/22 1841 11/26/22 1842 11/26/22 2104 11/27/22 0057 11/27/22 8119 11/27/22 0956 11/27/22 1405 11/29/22 0354 11/30/22 0339  NA 119* 121* 120* 121* 131* 133* 136 134* 132* 131*    HTN Blood pressure elevated to 140s and 160s in the last 24 hours. PTA on amlodipine, Lasix, HCTZ, losartan.  Unsure of compliance Blood pressure is normal on amlodipine 5 mg daily. IV hydralazine as needed continue  Left-sided skull lesion noted on MRI Needs outpatient workup for malignancy    Mobility: PT eval obtained.  Home with PT recommended  Goals of care   Code Status: Full Code     DVT prophylaxis:  heparin injection 5,000 Units Start: 11/26/22 2130   Antimicrobials: None currently Fluid: NS at 50 mill per hour Consultants: Neurology, PCCM Family Communication: None at bedside.  Discussed with patient's son on the phone yesterday  Status: Inpatient Level of care:  Progressive   Patient from: Home Anticipated d/c to: Pending clinical course Needs to continue in-hospital care:  Creatinine worsening.  Unable to discharge home today.  Hopefully home tomorrow   Diet:  Diet Order             Diet Carb Modified Fluid consistency: Thin; Room service appropriate? Yes with Assist  Diet effective now                   Scheduled Meds:  acetaminophen  1,000 mg Oral Q8H   amLODipine  5 mg Oral Daily   Chlorhexidine Gluconate Cloth  6 each Topical Daily   glipiZIDE  5 mg Oral Q breakfast   heparin injection (subcutaneous)  5,000 Units Subcutaneous Q8H   insulin aspart  0-5 Units  Subcutaneous QHS   insulin aspart  0-9 Units Subcutaneous TID WC   insulin glargine-yfgn  25 Units Subcutaneous Daily   levETIRAcetam  500 mg Oral Q12H   linagliptin  5 mg Oral Daily    PRN meds: dextrose, HYDROmorphone (DILAUDID) injection, oxyCODONE   Infusions:   sodium chloride 75 mL/hr at 11/30/22 1478    Antimicrobials: Anti-infectives (From admission, onward)    None       Nutritional status:  Body mass index is 25.41 kg/m.          Objective: Vitals:   11/30/22 0329 11/30/22 0822  BP: 122/66 114/62  Pulse: 88 81  Resp: 18 18  Temp: 98.8 F (37.1 C) 98.7 F (37.1 C)  SpO2: 97% 96%    Intake/Output Summary (Last 24 hours) at 11/30/2022 1031 Last data filed at 11/30/2022 0749 Gross per 24 hour  Intake 290.1 ml  Output 200 ml  Net 90.1 ml    Filed Weights   11/26/22 1838  Weight: 85 kg   Weight change:  Body mass index is 25.41 kg/m.   Physical Exam: General exam: Pleasant, elderly African-American male.   Skin: No rashes, lesions or ulcers. HEENT: Atraumatic, normocephalic, no obvious  bleeding Lungs: Clear to auscultation bilaterally CVS: Regular rate and rhythm, no murmur GI/Abd soft, nontender, nondistended, bowel sound present CNS: Alert, awake, oriented x 3.   Psychiatry: Mood appropriate Extremities: No pedal edema, no calf tenderness.  Tenderness and swelling noted in left ankle.  Data Review: I have personally reviewed the laboratory data and studies available.  F/u labs ordered Unresulted Labs (From admission, onward)    None       Total time spent in review of labs and imaging, patient evaluation, formulation of plan, documentation and communication with family: 45 minutes  Signed, Lorin Glass, MD [facility] 11/30/2022

## 2022-11-30 NOTE — Progress Notes (Signed)
ORTHOPAEDIC CONSULTATION  REQUESTING PHYSICIAN: Lorin Glass, MD  Chief Complaint: "my left ankle hurts"  HPI: Lucas Lin is a 82 y.o. male who presents with left ankle pain.  Had seizure on Sunday requiring ICU admission.  Since moving down from the ICU, he has had increased left ankle pain that he has noticed.  In particular he noticed it when working with OT yesterday.  He had radiographs demonstrating mild degenerative changes of the left ankle with osteophyte/enthesophyte formation.  No acute ankle fracture.  Denies any history of injury or surgery to the ankle.  He feels that the ankle is swollen.  He denies any fevers or chills.  Follows with Dr. August Saucer for right shoulder pain in particular and actually had a appointment with our office scheduled for today for his right shoulder.  He does recall a couple episodes of gout in his past.  Past Medical History:  Diagnosis Date   Hypertension     Social History   Socioeconomic History   Marital status: Single    Spouse name: Not on file   Number of children: Not on file   Years of education: Not on file   Highest education level: Not on file  Occupational History   Not on file  Tobacco Use   Smoking status: Former   Smokeless tobacco: Never  Vaping Use   Vaping Use: Never used  Substance and Sexual Activity   Alcohol use: Never   Drug use: Not on file   Sexual activity: Not on file  Other Topics Concern   Not on file  Social History Narrative   Not on file   Social Determinants of Health   Financial Resource Strain: Not on file  Food Insecurity: No Food Insecurity (11/29/2022)   Hunger Vital Sign    Worried About Running Out of Food in the Last Year: Never true    Ran Out of Food in the Last Year: Never true  Transportation Needs: No Transportation Needs (11/29/2022)   PRAPARE - Administrator, Civil Service (Medical): No    Lack of Transportation (Non-Medical): No  Physical Activity: Not on file   Stress: Not on file  Social Connections: Not on file   No family history on file. - negative except otherwise stated in the family history section Allergies  Allergen Reactions   Doxazosin     Other reaction(s): Cough (ALLERGY/intolerance)   Lisinopril Swelling   Prior to Admission medications   Medication Sig Start Date End Date Taking? Authorizing Provider  furosemide (LASIX) 20 MG tablet Take 20 mg by mouth daily as needed for fluid.   Yes [provider]  gemfibrozil (LOPID) 600 MG tablet Take 600 mg by mouth 2 (two) times daily before a meal. 08/05/11  Yes [provider]  KERENDIA 10 MG TABS Take 1 tablet by mouth daily.   Yes [provider]  oxyCODONE-acetaminophen (PERCOCET) 5-325 MG tablet Take 1 tablet by mouth every 4 (four) hours as needed for severe pain. 05/24/19  Yes Milagros Loll, MD  amLODipine (NORVASC) 10 MG tablet Take 10 mg by mouth daily. 09/21/16   [provider]   DG Ankle Complete Left  Result Date: 11/29/2022 CLINICAL DATA:  Left ankle pain. EXAM: LEFT ANKLE COMPLETE - 3+ VIEW COMPARISON:  None Available. FINDINGS: No fracture. No dislocation. There is mild tibial talar spurring. Minimal hindfoot degenerative spurring. Moderate plantar calcaneal spur and Achilles tendon enthesophyte. No erosive change or focal bone abnormality. Intact  talar dome. No ankle joint effusion. Allowing for overlying sock artifact, soft tissues are unremarkable. IMPRESSION: 1. Mild osteoarthritis, most prominent at the tibiotalar joint. 2. Moderate plantar calcaneal spur and Achilles tendon enthesophyte. Electronically Signed   By: Narda Rutherford M.D.   On: 11/29/2022 21:23   - pertinent xrays, CT, MRI studies were reviewed and independently interpreted  Positive ROS: All other systems have been reviewed and were otherwise negative with the exception of those mentioned in the HPI and as above.  Physical Exam: General: Alert, no acute  distress Psychiatric: Patient is competent for consent with normal mood and affect Lymphatic: No axillary or cervical lymphadenopathy Cardiovascular: No pedal edema Respiratory: No cyanosis, no use of accessory musculature GI: No organomegaly, abdomen is soft and non-tender    Images:  @ENCIMAGES @  Labs:  Lab Results  Component Value Date   HGBA1C 12.9 (H) 11/26/2022   HGBA1C (H) 05/03/2009    11.5 (NOTE) The ADA recommends the following therapeutic goal for glycemic control related to Hgb A1c measurement: Goal of therapy: <6.5 Hgb A1c  Reference: American Diabetes Association: Clinical Practice Recommendations 2010, Diabetes Care, 2010, 33: (Suppl  1).   REPTSTATUS 05/05/2009 FINAL 05/03/2009   CULT NO GROWTH 05/03/2009    Lab Results  Component Value Date   ALBUMIN 3.9 11/26/2022   ALBUMIN 3.7 05/03/2009    Neurologic: Patient does not have protective sensation bilateral lower extremities.   MUSCULOSKELETAL:   Ortho exam demonstrates left ankle and foot with palpable DP pulse.  Intact ankle dorsiflexion and plantarflexion.  He has no calf tenderness.  Negative Homans' sign.  No evidence of cellulitis.  Mild tenderness over the first MTP joint.  Moderate tenderness over the ankle joint line.  He has increased pain with passive motion of the ankle joint.  Assessment: Left ankle effusion  Plan: Plan to aspirate left ankle today.  Left ankle was anesthetized locally with 5 cc of lidocaine and then 6 cc of cloudy but nonpurulent synovial fluid was aspirated from the left ankle.  This appears most consistent with gout which patient endorses a history of.  This will be sent off for synovial fluid analysis and culture.  After aspiration, combination of 3 cc of Marcaine with 0.5 cc of Toradol was administered for patient comfort and hopefully he will be more mobile.  We will follow-up with the cell count and fluid analysis but based on the appearance of the fluid, do not  anticipate need for surgical I&D.  Unfortunately, could not do any cortisone injection today with his diabetes significantly not controlled.  Thank you for the consult and the opportunity to see Lucas Lin, [place] [facility] [phone] 12:36 PM

## 2022-12-01 ENCOUNTER — Other Ambulatory Visit (HOSPITAL_COMMUNITY): Payer: Self-pay

## 2022-12-01 DIAGNOSIS — G40901 Epilepsy, unspecified, not intractable, with status epilepticus: Secondary | ICD-10-CM | POA: Diagnosis not present

## 2022-12-01 LAB — CBC WITH DIFFERENTIAL/PLATELET
Abs Immature Granulocytes: 0.04 10*3/uL (ref 0.00–0.07)
Basophils Absolute: 0 10*3/uL (ref 0.0–0.1)
Basophils Relative: 1 %
Eosinophils Absolute: 0.3 10*3/uL (ref 0.0–0.5)
Eosinophils Relative: 4 %
HCT: 30.5 % — ABNORMAL LOW (ref 39.0–52.0)
Hemoglobin: 10.5 g/dL — ABNORMAL LOW (ref 13.0–17.0)
Immature Granulocytes: 1 %
Lymphocytes Relative: 22 %
Lymphs Abs: 1.6 10*3/uL (ref 0.7–4.0)
MCH: 29.3 pg (ref 26.0–34.0)
MCHC: 34.4 g/dL (ref 30.0–36.0)
MCV: 85.2 fL (ref 80.0–100.0)
Monocytes Absolute: 0.6 10*3/uL (ref 0.1–1.0)
Monocytes Relative: 8 %
Neutro Abs: 5 10*3/uL (ref 1.7–7.7)
Neutrophils Relative %: 64 %
Platelets: 198 10*3/uL (ref 150–400)
RBC: 3.58 MIL/uL — ABNORMAL LOW (ref 4.22–5.81)
RDW: 14.7 % (ref 11.5–15.5)
WBC: 7.6 10*3/uL (ref 4.0–10.5)
nRBC: 0 % (ref 0.0–0.2)

## 2022-12-01 LAB — BASIC METABOLIC PANEL
Anion gap: 9 (ref 5–15)
BUN: 44 mg/dL — ABNORMAL HIGH (ref 8–23)
CO2: 21 mmol/L — ABNORMAL LOW (ref 22–32)
Calcium: 8.7 mg/dL — ABNORMAL LOW (ref 8.9–10.3)
Chloride: 102 mmol/L (ref 98–111)
Creatinine, Ser: 1.85 mg/dL — ABNORMAL HIGH (ref 0.61–1.24)
GFR, Estimated: 36 mL/min — ABNORMAL LOW (ref >60)
Glucose, Bld: 189 mg/dL — ABNORMAL HIGH (ref 70–99)
Potassium: 3.9 mmol/L (ref 3.5–5.1)
Sodium: 132 mmol/L — ABNORMAL LOW (ref 135–145)

## 2022-12-01 LAB — GLUCOSE, CAPILLARY
Glucose-Capillary: 164 mg/dL — ABNORMAL HIGH (ref 70–99)
Glucose-Capillary: 181 mg/dL — ABNORMAL HIGH (ref 70–99)

## 2022-12-01 LAB — BODY FLUID CULTURE W GRAM STAIN

## 2022-12-01 MED ORDER — INSULIN GLARGINE 100 UNIT/ML SOLOSTAR PEN
25.0000 [IU] | PEN_INJECTOR | Freq: Every day | SUBCUTANEOUS | 2 refills | Status: AC
Start: 2022-12-01 — End: 2023-03-01
  Filled 2022-12-01: qty 9, 30d supply, fill #0

## 2022-12-01 MED ORDER — ACETAMINOPHEN 500 MG PO TABS
1000.0000 mg | ORAL_TABLET | Freq: Three times a day (TID) | ORAL | 0 refills | Status: AC
  Filled 2022-12-01: qty 100, 17d supply, fill #0

## 2022-12-01 MED ORDER — COLCHICINE 0.6 MG PO TABS
0.6000 mg | ORAL_TABLET | Freq: Once | ORAL | Status: AC
  Administered 2022-12-01: 0.6 mg via ORAL
  Filled 2022-12-01: qty 1

## 2022-12-01 MED ORDER — GVOKE HYPOPEN 2-PACK 1 MG/0.2ML ~~LOC~~ SOAJ
1.0000 mg | SUBCUTANEOUS | 0 refills | Status: AC | PRN
  Filled 2022-12-01: qty 0.4, 1d supply, fill #0

## 2022-12-01 MED ORDER — LEVETIRACETAM 500 MG PO TABS
500.0000 mg | ORAL_TABLET | Freq: Two times a day (BID) | ORAL | 2 refills | Status: DC
  Filled 2022-12-01: qty 60, 30d supply, fill #0

## 2022-12-01 MED ORDER — BLOOD GLUCOSE MONITOR SYSTEM W/DEVICE KIT
1.0000 | PACK | Freq: Three times a day (TID) | 0 refills | Status: AC
Start: 2022-12-01 — End: ?
  Filled 2022-12-01: qty 1, 30d supply, fill #0

## 2022-12-01 MED ORDER — ACCU-CHEK SOFTCLIX LANCETS MISC
1.0000 | Freq: Three times a day (TID) | 0 refills | Status: AC
  Filled 2022-12-01: qty 100, 30d supply, fill #0

## 2022-12-01 MED ORDER — LANCET DEVICE MISC
1.0000 | Freq: Three times a day (TID) | 0 refills | Status: AC
Start: 2022-12-01 — End: ?
  Filled 2022-12-01: qty 1, fill #0

## 2022-12-01 MED ORDER — OXYCODONE HCL 5 MG PO TABS
5.0000 mg | ORAL_TABLET | Freq: Four times a day (QID) | ORAL | 0 refills | Status: AC | PRN
  Filled 2022-12-01: qty 20, 5d supply, fill #0

## 2022-12-01 MED ORDER — INSULIN LISPRO (1 UNIT DIAL) 100 UNIT/ML (KWIKPEN)
6.0000 [IU] | PEN_INJECTOR | Freq: Three times a day (TID) | SUBCUTANEOUS | 2 refills | Status: AC
Start: 2022-12-01 — End: 2023-03-01
  Filled 2022-12-01: qty 6, 30d supply, fill #0

## 2022-12-01 MED ORDER — INSULIN PEN NEEDLE 32G X 4 MM MISC
1.0000 | Freq: Three times a day (TID) | 0 refills | Status: AC
  Filled 2022-12-01: qty 100, 30d supply, fill #0

## 2022-12-01 MED ORDER — COLCHICINE 0.6 MG PO TABS
1.2000 mg | ORAL_TABLET | Freq: Once | ORAL | Status: AC
  Administered 2022-12-01: 1.2 mg via ORAL
  Filled 2022-12-01: qty 2

## 2022-12-01 MED ORDER — BLOOD GLUCOSE TEST VI STRP
1.0000 | ORAL_STRIP | Freq: Three times a day (TID) | 0 refills | Status: AC
Start: 2022-12-01 — End: ?
  Filled 2022-12-01: qty 100, 30d supply, fill #0

## 2022-12-01 NOTE — Care Management Important Message (Signed)
Important Message  Patient Details  Name: Lucas Lin MRN: [id number] Date of Birth: March 16, 1941   Medicare Important Message Given:  Yes     [name] Stefan Church 12/01/2022, 1:46 PM

## 2022-12-01 NOTE — TOC Transition Note (Signed)
Transition of Care [facility]) - CM/SW Discharge Note   Patient Details  Name: Lucas Lin MRN: [id number] Date of Birth: [birth year]  [facility] CM/SW Contact:  Kermit Balo, RN Phone Number: 12/01/2022, 11:06 AM   Clinical Narrative:    Pt is discharging home with outpatient therapy through [facility]. Referral sent and information on the AVS.  DME for home has been delivered to the room.  Pt has supervision at home and transportation to home.    Final next level of care: OP Rehab Barriers to Discharge: No Barriers Identified   Patient Goals and CMS Choice      Discharge Placement                         Discharge Plan and Services Additional resources added to the After Visit Summary for                  DME Arranged: Bedside commode, Walker rolling DME Agency: [employer] Date DME Agency Contacted: 11/30/22 Time DME Agency Contacted: [id number] Representative spoke with at DME Agency: Barbara Cower            Social Determinants of Health (SDOH) Interventions SDOH Screenings   Food Insecurity: No Food Insecurity (11/29/2022)  Housing: Low Risk  (11/29/2022)  Transportation Needs: No Transportation Needs (11/29/2022)  Utilities: Not At Risk (11/29/2022)  Tobacco Use: Medium Risk (11/30/2022)     Readmission Risk Interventions     No data to display

## 2022-12-01 NOTE — Progress Notes (Signed)
Occupational Therapy Treatment Patient Details Name: Lucas Lin MRN: [id number] DOB: 1940-10-16 Today's Date: 12/01/2022   History of present illness Patient is an 82 y/o male admitted 11/26/22 with seizures.  PMH positive for DM, HLD, HTN.  Found to have hyperosmolar hyperglycemia syndrome. Also MRI negative for acute abnormality, but noted possible area in L parietal calvarium suspicious for metastasis.   OT comments  Patient making good gains with OT treatment with supervision to get to EOB and min guard for self care at sink with no seated rest break. Patient required cues for safety with walker use and assistance with donning sock on LLE. Patient is expected to discharge home with no OT follow up. Acute OT to continue to follow.    Recommendations for follow up therapy are one component of a multi-disciplinary discharge planning process, led by the attending physician.  Recommendations may be updated based on patient status, additional functional criteria and insurance authorization.    Assistance Recommended at Discharge Intermittent Supervision/Assistance  Patient can return home with the following  Assist for transportation;Assistance with cooking/housework   Equipment Recommendations  BSC/3in1    Recommendations for Other Services      Precautions / Restrictions Precautions Precautions: Fall Precaution Comments: seizure Restrictions Weight Bearing Restrictions: No       Mobility Bed Mobility Overal bed mobility: Needs Assistance Bed Mobility: Supine to Sit     Supine to sit: Supervision, HOB elevated     General bed mobility comments: Supervision for safety, HOB elevated    Transfers Overall transfer level: Needs assistance Equipment used: Rolling walker (2 wheels) Transfers: Sit to/from Stand Sit to Stand: Min guard           General transfer comment: ambulated to sink for self care and to recliner without a seated rest break     Balance Overall  balance assessment: Needs assistance Sitting-balance support: No upper extremity supported, Feet supported Sitting balance-Leahy Scale: Good     Standing balance support: Single extremity supported, Bilateral upper extremity supported, No upper extremity supported, During functional activity Standing balance-Leahy Scale: Poor Standing balance comment: reliant on RW or sink for support, able to stand with no UE support for short periods during self care                           ADL either performed or assessed with clinical judgement   ADL Overall ADL's : Needs assistance/impaired     Grooming: Wash/dry hands;Wash/dry face;Oral care;Min guard;Standing   Upper Body Bathing: Min guard;Cueing for sequencing;Standing Upper Body Bathing Details (indicate cue type and reason): at sink         Lower Body Dressing: Minimal assistance;Sit to/from stand Lower Body Dressing Details (indicate cue type and reason): assistance with donning sock on LLE               General ADL Comments: required assistance with LB dressing, states his son will be able to assist    Extremity/Trunk Assessment              Vision       Perception     Praxis      Cognition Arousal/Alertness: Awake/alert Behavior During Therapy: WFL for tasks assessed/performed Overall Cognitive Status: Within Functional Limits for tasks assessed  General Comments: slow processing        Exercises      Shoulder Instructions       General Comments      Pertinent Vitals/ Pain       Pain Assessment Pain Assessment: Faces Faces Pain Scale: Hurts a little bit Pain Location: Left ankle with weight bearing Pain Descriptors / Indicators: Discomfort, Grimacing, Guarding Pain Intervention(s): Monitored during session, Repositioned  Home Living                                          Prior Functioning/Environment               Frequency  Min 2X/week        Progress Toward Goals  OT Goals(current goals can now be found in the care plan section)  Progress towards OT goals: Progressing toward goals  Acute Rehab OT Goals Patient Stated Goal: go home OT Goal Formulation: With patient Time For Goal Achievement: 12/13/22 ADL Goals Pt Will Perform Grooming: Independently;standing Pt Will Perform Lower Body Dressing: Independently;sit to/from stand Pt Will Transfer to Toilet: Independently;ambulating;regular height toilet  Plan Discharge plan remains appropriate    Co-evaluation                 AM-PAC OT "6 Clicks" Daily Activity     Outcome Measure   Help from another person eating meals?: None Help from another person taking care of personal grooming?: None Help from another person toileting, which includes using toliet, bedpan, or urinal?: A Little Help from another person bathing (including washing, rinsing, drying)?: A Little Help from another person to put on and taking off regular upper body clothing?: None Help from another person to put on and taking off regular lower body clothing?: A Little 6 Click Score: 21    End of Session Equipment Utilized During Treatment: Gait belt;Rolling walker (2 wheels)  OT Visit Diagnosis: Unsteadiness on feet (R26.81);Pain Pain - Right/Left: Left Pain - part of body: Ankle and joints of foot   Activity Tolerance Patient tolerated treatment well   Patient Left in chair;with call bell/phone within reach;with chair alarm set   Nurse Communication Mobility status        Time: 4098-1191 OT Time Calculation (min): 24 min  Charges: OT General Charges $OT Visit: 1 Visit OT Treatments $Self Care/Home Management : 23-37 mins  Alfonse Flavors, OTA Acute Rehabilitation Services  Office [phone]   Dewain Penning 12/01/2022, 12:40 PM

## 2022-12-01 NOTE — Plan of Care (Signed)
Problem: Education: Goal: Ability to describe self-care measures that may prevent or decrease complications (Diabetes Survival Skills Education) will improve Outcome: Adequate for Discharge Goal: Individualized Educational Video(s) Outcome: Adequate for Discharge   Problem: Coping: Goal: Ability to adjust to condition or change in health will improve Outcome: Adequate for Discharge   Problem: Fluid Volume: Goal: Ability to maintain a balanced intake and output will improve Outcome: Adequate for Discharge   Problem: Health Behavior/Discharge Planning: Goal: Ability to identify and utilize available resources and services will improve Outcome: Adequate for Discharge Goal: Ability to manage health-related needs will improve Outcome: Adequate for Discharge   Problem: Metabolic: Goal: Ability to maintain appropriate glucose levels will improve Outcome: Adequate for Discharge   Problem: Nutritional: Goal: Maintenance of adequate nutrition will improve Outcome: Adequate for Discharge Goal: Progress toward achieving an optimal weight will improve Outcome: Adequate for Discharge   Problem: Skin Integrity: Goal: Risk for impaired skin integrity will decrease Outcome: Adequate for Discharge   Problem: Tissue Perfusion: Goal: Adequacy of tissue perfusion will improve Outcome: Adequate for Discharge   Problem: Education: Goal: Ability to describe self-care measures that may prevent or decrease complications (Diabetes Survival Skills Education) will improve Outcome: Adequate for Discharge Goal: Individualized Educational Video(s) Outcome: Adequate for Discharge   Problem: Cardiac: Goal: Ability to maintain an adequate cardiac output will improve Outcome: Adequate for Discharge   Problem: Health Behavior/Discharge Planning: Goal: Ability to identify and utilize available resources and services will improve Outcome: Adequate for Discharge Goal: Ability to manage health-related  needs will improve Outcome: Adequate for Discharge   Problem: Fluid Volume: Goal: Ability to achieve a balanced intake and output will improve Outcome: Adequate for Discharge   Problem: Metabolic: Goal: Ability to maintain appropriate glucose levels will improve Outcome: Adequate for Discharge   Problem: Nutritional: Goal: Maintenance of adequate nutrition will improve Outcome: Adequate for Discharge Goal: Maintenance of adequate weight for body size and type will improve Outcome: Adequate for Discharge   Problem: Respiratory: Goal: Will regain and/or maintain adequate ventilation Outcome: Adequate for Discharge   Problem: Urinary Elimination: Goal: Ability to achieve and maintain adequate renal perfusion and functioning will improve Outcome: Adequate for Discharge   Problem: Education: Goal: Knowledge of General Education information will improve Description: Including pain rating scale, medication(s)/side effects and non-pharmacologic comfort measures Outcome: Adequate for Discharge   Problem: Health Behavior/Discharge Planning: Goal: Ability to manage health-related needs will improve Outcome: Adequate for Discharge   Problem: Clinical Measurements: Goal: Ability to maintain clinical measurements within normal limits will improve Outcome: Adequate for Discharge Goal: Will remain free from infection Outcome: Adequate for Discharge Goal: Diagnostic test results will improve Outcome: Adequate for Discharge Goal: Respiratory complications will improve Outcome: Adequate for Discharge Goal: Cardiovascular complication will be avoided Outcome: Adequate for Discharge   Problem: Activity: Goal: Risk for activity intolerance will decrease Outcome: Adequate for Discharge   Problem: Nutrition: Goal: Adequate nutrition will be maintained Outcome: Adequate for Discharge   Problem: Coping: Goal: Level of anxiety will decrease Outcome: Adequate for Discharge   Problem:  Elimination: Goal: Will not experience complications related to bowel motility Outcome: Adequate for Discharge Goal: Will not experience complications related to urinary retention Outcome: Adequate for Discharge   Problem: Pain Managment: Goal: General experience of comfort will improve Outcome: Adequate for Discharge   Problem: Safety: Goal: Ability to remain free from injury will improve Outcome: Adequate for Discharge   Problem: Skin Integrity: Goal: Risk for impaired skin integrity will decrease   Outcome: Adequate for Discharge   

## 2022-12-01 NOTE — Discharge Summary (Signed)
Physician Discharge Summary  Lucas Lin ZOX:[id number] DOB: 1941/04/10 DOA: 11/26/2022  PCP: Clovis Riley, [name], MD  Admit date: 11/26/2022 Discharge date: 12/01/2022  Admitted From: Home Discharge disposition: Home with home health and DME's  Recommendations at discharge:  You been started on insulin.  Please follow recommendations for compliance to blood sugar monitoring, medicines and follow-up For gout continue started on allopurinol and pain medicines. Follow-up with neurology as an outpatient for maintenance of seizure medicines You been started on amlodipine for blood pressure control. Seizure precautions as below  Discharge instruction for Seizure:  Per [facility] statutes, patients with seizures are not allowed to drive until  they have been seizure-free for six months. Use caution when using heavy equipment or power tools. Avoid working on ladders or at heights. Take showers instead of baths. Ensure the water temperature is not too high on the home water heater. Do not go swimming alone. When caring for infants or small children, sit down when holding, feeding, or changing them to minimize risk of injury to the child in the event you have a seizure.  Maintain good sleep hygiene. Avoid alcohol.   Brief narrative: Lucas Lin is a 82 y.o. male with PMH significant for DM2, HTN, HLD and h/o HHS with seizures 2010. 5/4, patient was found down having a tonic-clonic seizure.  Last known normal about 2 hours prior.  Per report, he was complaining of right upper extremity twitching over the last few days. Upon EMS arrival, patient was minimally interactive and had vomited. Patient was given versed and transferred to [facility] ED.  In the ED, patient was afebrile, hemodynamically stable.  Loaded with Keppra  Neurology consulted  CT head unremarkable  MRI brain unremarkable for acute intracranial abnormality but showed a nonspecific lucency in the left parietal calvarium  suspicious for metastasis.  Initial labs with CBC unremarkable, lactic acid elevated to 3.9, sodium level low at 119, potassium elevated at 5.7, glucose 1100, BUN/creatinine 79/4.24, anion gap elevated to 20 VBG with pH low at 7.3, beta-hydroxybutyrate level elevated in blood but UA without ketones. Patient was started on IV fluid, IV insulin drip. PCCM consulted and admitted to ICU 5/6, transferred out to [facility]  Subjective: Patient was seen and examined this morning.  Sitting up at the edge of the bed.  Taking his breakfast.   While working with OT yesterday, he mentioned left ankle pain that significantly limited his mobility.  Assessment and plan: Status epilepticus in the setting of hyperglycemia Acute metabolic encephalopathy CT head and MRI brain did not show any intracranial normality MRI brain showed left parietal calvarium lesion, likely metastatic disease While in ICU, patient had prolonged seizures for multiple days. Neurology was consulted EEG was obtained which did not show any epileptiform discharges throughout the duartion of recording. Per neurology recommendation 5/5, to continue Keppra 500 mg twice daily for few months.  To follow-up with neurology as an outpatient.  Ambulatory referral to neurology given.  Diabetic ketoacidosis  Hyperosmolar hyperglycemia syndrome On admission, patient had blood sugar elevated over 1000, serum ketone level elevated, anion gap elevated, acidotic pH 7.3.  He met criteria for both DKA and HHS Given aggressive IV hydration. Initially treated with insulin drip and later switched to subcu insulin.  Type 2 diabetes mellitus uncontrolled A1c 12.9 on 11/26/2022 PTA not on antidiabetic meds but patient states he had done insulin in the past.  He is inclined to resume insulin therapy. Currently on basal and bolus regimen  Diabetes care coordinator count appreciated.  Will discharge him on insulin regimen. Recent Labs  Lab 11/30/22 0826  11/30/22 1209 11/30/22 1709 11/30/22 2101 12/01/22 0729  GLUCAP 132* 258* 397* 339* 164*   AKI Unclear baseline because prior to this presentation, patient did not have labs for several years.   Presented with a creatinine elevated to 4.24, gradually improved with IV hydration.  Creatinine currently between 1.7 and 1.9.  This is probably his new baseline. Recent Labs    11/26/22 1828 11/26/22 1841 11/26/22 2104 11/27/22 0057 11/27/22 4098 11/27/22 0956 11/27/22 1405 11/29/22 0354 11/30/22 0339 12/01/22 0410  BUN 79* 76* 83* 74* 70* 65* 63* 33* 35* 44*  CREATININE 4.24* 4.50* 3.86* 3.54* 3.01* 2.80* 2.48* 1.42* 1.69* 1.85*   Left ankle gout Generalized arthritis Patient states he has generalized osteoarthritis and follows up with Dr. August Saucer as an outpatient.  In the past, he has required shots in his knee and shoulder. 5/7, while working with OT , he mentioned left ankle pain that significantly limited his mobility.  On exam, he was noted to have swelling and tenderness in the left ankle.  X-ray was obtained which showed mild osteoarthritis and moderate plantar calcaneal spur and Achilles tendon enthesophyte. Orthopedics was consulted.  Patient underwent arthrocentesis.  Fluid analysis showed monosodium urate crystals.   Follow-up recommendation, patient was given colchicine 1.2 mg followed by 0.6 mg in 1 hour.  Given his poor kidney function, we would avoid colchicine further.  Patient feels much better after arthrocentesis.  He has been able to put weight on it. Discharge on allopurinol as well as scheduled Tylenol, as needed oxycodone. Follow-up with orthopedics as an outpatient.  Hyponatremia Initial major sodium level was significantly low at 119 but corrected sodium level was better at 135. Sodium level gradually improved with improvement in blood sugar level.  However because of poor appetite, it is running low currently.  Expected to improve with improvement in  appetite. Recent Labs  Lab 11/26/22 1841 11/26/22 1842 11/26/22 2104 11/27/22 0057 11/27/22 1191 11/27/22 0956 11/27/22 1405 11/29/22 0354 11/30/22 0339 12/01/22 0410  NA 121* 120* 121* 131* 133* 136 134* 132* 131* 132*   HTN Blood pressure elevated to 140s and 160s in the last 24 hours. Patient is not sure if he was taking any blood pressure pill prior to admission. Blood pressure is normal on amlodipine 5 mg daily.  Discharged on the same.  Left-sided skull lesion noted on MRI Needs outpatient workup for malignancy.  I have informed patient and his son about this.  Need to follow-up with PCP for further workup.  Impaired mobility Seen by PT. Home with PT recommended  Goals of care   Code Status: Full Code   Wounds:  -    Discharge Exam:   Vitals:   11/30/22 2300 11/30/22 2328 12/01/22 0347 12/01/22 0812  BP:  125/64 127/62 122/68  Pulse:  73 74 69  Resp: 16 16 16 16   Temp:  98.2 F (36.8 C) 98.2 F (36.8 C) 98.2 F (36.8 C)  TempSrc:  Oral Oral Oral  SpO2:  98% 94% 94%  Weight:      Height:        Body mass index is 25.41 kg/m.  General exam: Pleasant, elderly African-American male.   Skin: No rashes, lesions or ulcers. HEENT: Atraumatic, normocephalic, no obvious bleeding Lungs: Clear to auscultation bilaterally CVS: Regular rate and rhythm, no murmur GI/Abd soft, nontender, nondistended, bowel sound present CNS: Alert, awake,  oriented x 3.   Psychiatry: Mood appropriate Extremities: No pedal edema, no calf tenderness.  Tenderness and swelling improving in left ankle.  Follow ups:    Follow-up Information     [facility]. Schedule an appointment as soon as possible for a visit in 1 week(s).   Specialty: Rehabilitation Contact information: [address] [id number] [place] [address] [phone]        Clovis Riley, [name], MD Follow up.   Specialty: Family Medicine Contact  information: [address] [place] [address] [phone]         [facility] .   Specialty: Radiology Contact information: [address] [place] [address] [phone]        August Saucer, Corrie Mckusick, MD Follow up.   Specialty: Orthopedic Surgery Contact information: [address] [place] [address] [phone]                 Discharge Instructions:   Discharge Instructions     Ambulatory referral to Neurology   Complete by: As directed    An appointment is requested in approximately: 4 weeks New seizure on Keppra   Ambulatory referral to Physical Therapy   Complete by: As directed    Call MD for:  difficulty breathing, headache or visual disturbances   Complete by: As directed    Call MD for:  extreme fatigue   Complete by: As directed    Call MD for:  hives   Complete by: As directed    Call MD for:  persistant dizziness or light-headedness   Complete by: As directed    Call MD for:  persistant nausea and vomiting   Complete by: As directed    Call MD for:  severe uncontrolled pain   Complete by: As directed    Call MD for:  temperature >100.4   Complete by: As directed    Diet Carb Modified   Complete by: As directed    Discharge instructions   Complete by: As directed    Recommendations at discharge:   You been started on insulin.  Please follow recommendations for compliance to blood sugar monitoring, medicines and follow-up  For gout continue started on allopurinol and pain medicines.  Follow-up with neurology as an outpatient for maintenance of seizure medicines  You been started on amlodipine for blood pressure control.  Discharge instructions for diabetes mellitus: Check blood sugar 3 times a day and bedtime at home. If blood sugar running above 200 or less than 70 please call your MD to adjust insulin. If you notice signs and symptoms of hypoglycemia  (low blood sugar) like jitteriness, confusion, thirst, tremor and sweating, please check blood sugar, drink sugary drink/biscuits/sweets to increase sugar level and call MD or return to ER.    Seizure precautions: -Per [facility] statutes, patients with seizures are not allowed to drive until they have been seizure-free for six months.  -Use caution when using heavy equipment or power tools.  -Avoid working on ladders or at heights.  -Take showers instead of baths. Ensure the water temperature is not too high on the home water heater.  -Do not go swimming alone.  -Do not lock yourself in a room alone (i.e. bathroom). -When caring for infants or small children, sit down when holding, feeding, or changing them to minimize risk of injury to the child in the event you have a seizure.  -Maintain good sleep hygiene. -  Avoid alcohol.    If patient has another seizure, call 911 and bring them back to the ED if: A.  The seizure lasts longer than 5 minutes.      B.  The patient doesn't wake shortly after the seizure or has new problems such as difficulty seeing, speaking or moving following the seizure C.  The patient was injured during the seizure D.  The patient has a temperature over 102 F (39C) E.  The patient vomited during the seizure and now is having trouble breathing    General discharge instructions: Follow with Primary MD Clovis Riley, [name], MD in 7 days  Please request your PCP  to go over your hospital tests, procedures, radiology results at the follow up. Please get your medicines reviewed and adjusted.  Your PCP may decide to repeat certain labs or tests as needed. Do not drive, operate heavy machinery, perform activities at heights, swimming or participation in water activities or provide baby sitting services if your were admitted for syncope or siezures until you have seen by Primary MD or a Neurologist and advised to do so again. [place] Controlled Substance Reporting  System database was reviewed. Do not drive, operate heavy machinery, perform activities at heights, swim, participate in water activities or provide baby-sitting services while on medications for pain, sleep and mood until your outpatient physician has reevaluated you and advised to do so again.  You are strongly recommended to comply with the dose, frequency and duration of prescribed medications. Activity: As tolerated with Full fall precautions use walker/cane & assistance as needed Avoid using any recreational substances like cigarette, tobacco, alcohol, or non-prescribed drug. If you experience worsening of your admission symptoms, develop shortness of breath, life threatening emergency, suicidal or homicidal thoughts you must seek medical attention immediately by calling 911 or calling your MD immediately  if symptoms less severe. You must read complete instructions/literature along with all the possible adverse reactions/side effects for all the medicines you take and that have been prescribed to you. Take any new medicine only after you have completely understood and accepted all the possible adverse reactions/side effects.  Wear Seat belts while driving. You were cared for by a hospitalist during your hospital stay. If you have any questions about your discharge medications or the care you received while you were in the hospital after you are discharged, you can call the unit and ask to speak with the hospitalist or the covering physician. Once you are discharged, your primary care physician will handle any further medical issues. Please note that NO REFILLS for any discharge medications will be authorized once you are discharged, as it is imperative that you return to your primary care physician (or establish a relationship with a primary care physician if you do not have one).   Increase activity slowly   Complete by: As directed        Discharge Medications:   Allergies as of 12/01/2022        Reactions   Doxazosin    Other reaction(s): Cough (ALLERGY/intolerance)   Lisinopril Swelling        Medication List     STOP taking these medications    furosemide 20 MG tablet Commonly known as: LASIX   gemfibrozil 600 MG tablet Commonly known as: LOPID   Kerendia 10 MG Tabs Generic drug: Finerenone   oxyCODONE-acetaminophen 5-325 MG tablet Commonly known as: Percocet       TAKE these medications    acetaminophen 500 MG tablet  Commonly known as: TYLENOL Take 2 tablets (1,000 mg total) by mouth every 8 (eight) hours for 5 days.   amLODipine 10 MG tablet Commonly known as: NORVASC Take 10 mg by mouth daily.   Blood Glucose Monitoring Suppl Devi 1 each by Does not apply route 3 (three) times daily. May dispense any manufacturer covered by patient's insurance.   BLOOD GLUCOSE TEST STRIPS Strp 1 each by Does not apply route 3 (three) times daily. Use as directed to check blood sugar. May dispense any manufacturer covered by patient's insurance and fits patient's device.   Gvoke HypoPen 2-Pack 1 MG/0.2ML Soaj Generic drug: Glucagon Inject 1 mg into the skin as needed for up to 2 doses (Severe low blood sugar).   insulin glargine 100 UNIT/ML Solostar Pen Commonly known as: LANTUS Inject 25 Units into the skin daily. May substitute as needed per insurance.   insulin lispro 100 UNIT/ML KwikPen Commonly known as: HUMALOG Inject 6 Units into the skin 3 (three) times daily with meals. Only take if eating a meal AND Blood Glucose (BG) is 80 or higher.   Lancet Device Misc 1 each by Does not apply route 3 (three) times daily. May dispense any manufacturer covered by patient's insurance.   Lancets Misc 1 each by Does not apply route 3 (three) times daily. Use as directed to check blood sugar. May dispense any manufacturer covered by patient's insurance and fits patient's device.   levETIRAcetam 500 MG tablet Commonly known as: KEPPRA Take 1 tablet (500 mg  total) by mouth every 12 (twelve) hours.   oxyCODONE 5 MG immediate release tablet Commonly known as: Oxy IR/ROXICODONE Take 1 tablet (5 mg total) by mouth every 6 (six) hours as needed for up to 5 days for moderate pain.   Pen Needles 31G X 5 MM Misc 1 each by Does not apply route 3 (three) times daily. May dispense any manufacturer covered by patient's insurance.               Durable Medical Equipment  (From admission, onward)           Start     Ordered   11/30/22 1006  For home use only DME Walker rolling  Once       Question Answer Comment  Walker: With 5 Inch Wheels   Patient needs a walker to treat with the following condition Weakness      11/30/22 1005   11/30/22 1006  For home use only DME Bedside commode  Once       Question:  Patient needs a bedside commode to treat with the following condition  Answer:  Weakness   11/30/22 1005             The results of significant diagnostics from this hospitalization (including imaging, microbiology, ancillary and laboratory) are listed below for reference.    Procedures and Diagnostic Studies:   Overnight EEG with video  Result Date: 11/27/2022 Lucas Quest, MD     11/27/2022  8:42 PM Patient Name: Lucas Lin MRN: [id number] Epilepsy Attending: Charlsie Lin Referring Physician/Provider: Gordy Councilman, MD Duration: 11/26/2022 2330 to 4098  Patient history: 82yo M with intermittent RUE twitching. EEG to evaluate for seizure  Level of alertness: awake, asleep  AEDs during EEG study: LEV, ativan  Technical aspects: This EEG study was done with scalp electrodes positioned according to the 10-20 International system of electrode placement. Electrical activity was reviewed with band pass filter of 1-70Hz ,  sensitivity of 7 uV/mm, display speed of 54mm/sec with a 60Hz  notched filter applied as appropriate. EEG data were recorded continuously and digitally stored.  Video monitoring was available and reviewed as  appropriate.  Description:  The posterior dominant rhythm consists of 8-9 Hz activity of moderate voltage (25-35 uV) seen predominantly in posterior head regions, symmetric and reactive to eye opening and eye closing. Sleep was characterized by sleep spindles (12 to 14 Hz), maximal frontocentral region. Sharply contoured waves were noted in left temporo-parietal region. Event button was pressed on 11/27/2022 at 2346 for head twitching.  Concomitant EEG before, during and after the event did not show any EEG changes to suggest seizure. Hyperventilation and photic stimulation were not performed.    IMPRESSION: This study during sleep only is within normal limits. No seizures or epileptiform discharges were seen throughout the recording. Event button was pressed on 11/27/2022 at 2346 for head twitching without concomitant EEG change. This was most likely NOT an epileptic event.  A normal interictal EEG does not exclude the diagnosis of epilepsy.  Lucas Lin   EEG adult  Result Date: 11/27/2022 Lucas Quest, MD     11/27/2022  5:43 AM Patient Name: Lucas Lin MRN: [id number] Epilepsy Attending: Charlsie Lin Referring Physician/Provider: Loetta Rough, MD Date: 11/26/2022 Duration: 29.25 mins Patient history: 82yo M with intermittent RUE twitching. EEG to evaluate for seizure Level of alertness: asleep AEDs during EEG study: LEV, ativan Technical aspects: This EEG study was done with scalp electrodes positioned according to the 10-20 International system of electrode placement. Electrical activity was reviewed with band pass filter of 1-70Hz , sensitivity of 7 uV/mm, display speed of 31mm/sec with a 60Hz  notched filter applied as appropriate. EEG data were recorded continuously and digitally stored.  Video monitoring was available and reviewed as appropriate. Description: Sleep was characterized by sleep spindles (12 to 14 Hz), maximal frontocentral region. Hyperventilation and photic stimulation were  not performed.   IMPRESSION: This study during sleep only is within normal limits. No seizures or epileptiform discharges were seen throughout the recording. A normal interictal EEG does not exclude the diagnosis of epilepsy. Lucas Lin   MR BRAIN WO CONTRAST  Result Date: 11/26/2022 CLINICAL DATA:  Stroke suspected EXAM: MRI HEAD WITHOUT CONTRAST TECHNIQUE: Multiplanar, multiecho pulse sequences of the brain and surrounding structures were obtained without intravenous contrast. COMPARISON:  05/04/2009 MRI head, correlation is also made with 11/26/2022 CT head FINDINGS: Evaluation is somewhat limited as the exam was terminated before obtaining the axial T1 and axial and coronal T2 sequences. Brain: No restricted diffusion to suggest acute or subacute infarct. No acute hemorrhage, mass, mass effect, or midline shift. No hydrocephalus or extra-axial collection. Foci hemosiderin deposition in the left basal ganglia and bilateral parietal lobes, favored to be the sequela of prior hypertensive microhemorrhages. Scattered T2 hyperintense signal in the periventricular white matter, likely the sequela of mild chronic small vessel ischemic disease. Vascular: Unable to evaluate with sequences provided. Skull and upper cervical spine: Possible diffusion restricting lesion in the left parietal calvarium (series 2, image 38), which correlates a focus of T2 hyperintense signal (series 3, image 26); this is not present on the 20/10 MRI. This also correlates with a lucency on the same day CT head (series 2, image 24 on that study). Sinuses/Orbits: No acute finding. Other: None. IMPRESSION: 1. Evaluation is somewhat limited as the exam was terminated before obtaining the axial T1 and axial and coronal T2 sequences. Within  this limitation, there is no evidence of acute or subacute infarct. 2. Possible diffusion restricting lesion in the left parietal calvarium, which correlates with a lucency on the same day CT head, which  is nonspecific but could represent metastatic disease. Electronically Signed   By: Wiliam Ke M.D.   On: 11/26/2022 21:27   DG Chest Portable 1 View  Result Date: 11/26/2022 CLINICAL DATA:  Fall, seizures EXAM: PORTABLE CHEST 1 VIEW COMPARISON:  05/24/2019 FINDINGS: Heart and mediastinal contours are within normal limits. No focal opacities or effusions. No acute bony abnormality. Degenerative changes in the shoulders bilaterally. Postoperative changes in the left shoulder. Mild gaseous distention of the stomach. IMPRESSION: No active disease. Gaseous distention of the stomach. Electronically Signed   By: Charlett Nose M.D.   On: 11/26/2022 19:40   CT Head Wo Contrast  Result Date: 11/26/2022 CLINICAL DATA:  Seizure, new-onset, no history of trauma EXAM: CT HEAD WITHOUT CONTRAST TECHNIQUE: Contiguous axial images were obtained from the base of the skull through the vertex without intravenous contrast. RADIATION DOSE REDUCTION: This exam was performed according to the departmental dose-optimization program which includes automated exposure control, adjustment of the mA and/or kV according to patient size and/or use of iterative reconstruction technique. COMPARISON:  05/24/2019 FINDINGS: Brain: Normal anatomic configuration. Parenchymal volume loss is commensurate with the patient's age. Stable mild periventricular white matter changes are present likely reflecting the sequela of small vessel ischemia. No abnormal intra or extra-axial mass lesion or fluid collection. No abnormal mass effect or midline shift. No evidence of acute intracranial hemorrhage or infarct. Ventricular size is normal. Cerebellum unremarkable. Vascular: No asymmetric hyperdense vasculature at the skull base. Skull: Intact Sinuses/Orbits: Paranasal sinuses are clear. Orbits are unremarkable. Other: Mastoid air cells and middle ear cavities are clear. IMPRESSION: 1. No acute intracranial hemorrhage or infarct. Stable senescent change.  Electronically Signed   By: Helyn Numbers M.D.   On: 11/26/2022 19:30     Labs:   Basic Metabolic Panel: Recent Labs  Lab 11/26/22 1828 11/26/22 1841 11/27/22 1610 11/27/22 0956 11/27/22 1405 11/29/22 0354 11/30/22 0339 12/01/22 0410  NA 119*   < > 133* 136 134* 132* 131* 132*  K 5.7*   < > 3.6 3.6 3.6 3.7 4.0 3.9  CL 82*   < > 95* 98 97* 99 98 102  CO2 17*   < > 24 26 25 23  20* 21*  GLUCOSE 1,100*   < > 299* 159* 142* 144* 207* 189*  BUN 79*   < > 70* 65* 63* 33* 35* 44*  CREATININE 4.24*   < > 3.01* 2.80* 2.48* 1.42* 1.69* 1.85*  CALCIUM 9.3   < > 9.7 9.8 9.5 9.2 9.1 8.7*  MG 2.7*  --  2.8*  --   --   --   --   --   PHOS  --   --  3.7  --   --   --   --   --    < > = values in this interval not displayed.   GFR Estimated Creatinine Clearance: 33.2 mL/min (A) (by C-G formula based on SCr of 1.85 mg/dL (H)). Liver Function Tests: Recent Labs  Lab 11/26/22 1828  AST 29  ALT 20  ALKPHOS 131*  BILITOT 1.0  PROT 7.9  ALBUMIN 3.9   Recent Labs  Lab 11/26/22 2104 11/27/22 1405  LIPASE 78* 43   No results for input(s): "AMMONIA" in the last 168 hours. Coagulation profile Recent Labs  Lab 11/26/22 1828 11/26/22 1936  INR 1.1 1.2    CBC: Recent Labs  Lab 11/26/22 1828 11/26/22 1841 11/26/22 1842 11/27/22 0607 11/29/22 0354 11/30/22 0339 12/01/22 0410  WBC 8.5  --   --  11.6* 8.5 8.7 7.6  NEUTROABS 7.4  --   --   --  5.6 6.0 5.0  HGB 13.7   < > 15.0 12.6* 13.2 12.4* 10.5*  HCT 39.7   < > 44.0 35.5* 39.2 36.5* 30.5*  MCV 84.3  --   --  82.6 85.4 84.9 85.2  PLT 226  --   --  223 198 208 198   < > = values in this interval not displayed.   Cardiac Enzymes: Recent Labs  Lab 11/26/22 1828  CKTOTAL 619*   BNP: Invalid input(s): "POCBNP" CBG: Recent Labs  Lab 11/30/22 0826 11/30/22 1209 11/30/22 1709 11/30/22 2101 12/01/22 0729  GLUCAP 132* 258* 397* 339* 164*   D-Dimer No results for input(s): "DDIMER" in the last 72 hours. Hgb A1c No  results for input(s): "HGBA1C" in the last 72 hours. Lipid Profile Recent Labs    11/30/22 0339  CHOL 147  HDL 47  LDLCALC 85  TRIG 77  CHOLHDL 3.1   Thyroid function studies No results for input(s): "TSH", "T4TOTAL", "T3FREE", "THYROIDAB" in the last 72 hours.  Invalid input(s): "FREET3" Anemia work up No results for input(s): "VITAMINB12", "FOLATE", "FERRITIN", "TIBC", "IRON", "RETICCTPCT" in the last 72 hours. Microbiology Recent Results (from the past 240 hour(s))  MRSA Next Gen by PCR, Nasal     Status: None   Collection Time: 11/26/22 11:28 PM   Specimen: Nasal Mucosa; Nasal Swab  Result Value Ref Range Status   MRSA by PCR Next Gen NOT DETECTED NOT DETECTED Final    Comment: (NOTE) The GeneXpert MRSA Assay (FDA approved for NASAL specimens only), is one component of a comprehensive MRSA colonization surveillance program. It is not intended to diagnose MRSA infection nor to guide or monitor treatment for MRSA infections. Test performance is not FDA approved in patients less than 24 years old. Performed at [facility], [address], [place], [place] [address]   Body fluid culture w Gram Stain     Status: None (Preliminary result)   Collection Time: 11/30/22 12:15 PM   Specimen: Synovial Fluid  Result Value Ref Range Status   Specimen Description FLUID SYNOVIAL  Final   Special Requests ANKLE  Final   Gram Stain   Final    ABUNDANT WBC PRESENT, PREDOMINANTLY PMN NO ORGANISMS SEEN    Culture   Final    NO GROWTH < 24 HOURS Performed at [facility], [address], [place], [place] [address]    Report Status PENDING  Incomplete    Time coordinating discharge: 45 minutes  Signed: Tennis Mckinnon  [facility] 12/01/2022, 10:47 AM

## 2022-12-01 NOTE — Inpatient Diabetes Management (Signed)
Inpatient Diabetes Program Recommendations  AACE/ADA: New Consensus Statement on Inpatient Glycemic Control (2015)  Target Ranges:  Prepandial:   less than 140 mg/dL      Peak postprandial:   less than 180 mg/dL (1-2 hours)      Critically ill patients:  140 - 180 mg/dL   Lab Results  Component Value Date   GLUCAP 164 (H) 12/01/2022   HGBA1C 12.9 (H) 11/26/2022    Review of Glycemic Control  Latest Reference Range & Units 11/30/22 08:26 11/30/22 12:09 11/30/22 17:09 11/30/22 21:01 12/01/22 07:29  Glucose-Capillary 70 - 99 mg/dL 161 (H) 096 (H) 045 (H) 339 (H) 164 (H)  (H): Data is abnormally high  Discharge Recommendations: Long acting recommendations: Insulin Glargine (LANTUS) Solostar Pen 25 units QAM  Short acting recommendations:  Meal coverage ONLY Insulin lispro (HUMALOG) KwikPen  6 units TID with meals   Hypoglycemia treatment recommendations: GVoke 1mg  Supply/Referral recommendations: Glucometer Test strips Lancet device Lancets Pen needles - standard  Spoke with patient and son at bedside.  Patient has been on insulin in the past.  Has used the insulin pen.  Reviewed insulin pen use at home. Reviewed contents of insulin flexpen starter kit. Reviewed all steps of insulin pen including attachment of needle, 2-unit air shot, dialing up dose, giving injection, removing needle, disposal of sharps, storage of unused insulin, disposal of insulin etc.  Also reviewed troubleshooting with insulin pen. MD to give patient Rxs for insulin pens and insulin pen needles.  Educated on The Plate Method, CHO's, portion control, CBGs at home fasting and mid afternoon, F/U with PCP every 3 months, bring meter to PCP office, long and short term complications of uncontrolled BG, and importance of exercise.  Educated on hypoglycemia, signs, symptoms and treatments.  Also educated on G-Voke-rescue injection for severe hypoglycemia.  He should call his PCP if he has glucose trends consistently <100  mg/dL.    Will continue to follow while inpatient.  Thank you, Dulce Sellar, MSN, CDCES Diabetes Coordinator Inpatient Diabetes Program [phone] (team pager from 8a-5p)

## 2022-12-02 LAB — ANAEROBIC CULTURE W GRAM STAIN

## 2022-12-03 LAB — PROTIME-INR

## 2022-12-03 LAB — BODY FLUID CULTURE W GRAM STAIN

## 2022-12-04 LAB — BODY FLUID CULTURE W GRAM STAIN

## 2022-12-05 LAB — ANAEROBIC CULTURE W GRAM STAIN

## 2022-12-07 ENCOUNTER — Ambulatory Visit (INDEPENDENT_AMBULATORY_CARE_PROVIDER_SITE_OTHER): Payer: 59 | Admitting: Neurology

## 2022-12-07 ENCOUNTER — Telehealth: Payer: Self-pay | Admitting: Neurology

## 2022-12-07 ENCOUNTER — Telehealth: Payer: Self-pay

## 2022-12-07 ENCOUNTER — Encounter: Payer: Self-pay | Admitting: Neurology

## 2022-12-07 VITALS — BP 161/76 | HR 96 | Ht 67.0 in | Wt 184.0 lb

## 2022-12-07 DIAGNOSIS — E1142 Type 2 diabetes mellitus with diabetic polyneuropathy: Secondary | ICD-10-CM | POA: Diagnosis not present

## 2022-12-07 DIAGNOSIS — M545 Low back pain, unspecified: Secondary | ICD-10-CM

## 2022-12-07 DIAGNOSIS — G40209 Localization-related (focal) (partial) symptomatic epilepsy and epileptic syndromes with complex partial seizures, not intractable, without status epilepticus: Secondary | ICD-10-CM

## 2022-12-07 DIAGNOSIS — G40901 Epilepsy, unspecified, not intractable, with status epilepticus: Secondary | ICD-10-CM | POA: Diagnosis not present

## 2022-12-07 DIAGNOSIS — Z7984 Long term (current) use of oral hypoglycemic drugs: Secondary | ICD-10-CM

## 2022-12-07 MED ORDER — LEVETIRACETAM 500 MG PO TABS: 500.0000 mg | ORAL_TABLET | Freq: Two times a day (BID) | ORAL | 3 refills | Status: AC

## 2022-12-07 NOTE — Telephone Encounter (Signed)
medicare/[employer] Berkley Harvey: [id number] exp. 12/07/22-01/21/23 sent to GI [phone]

## 2022-12-07 NOTE — Telephone Encounter (Signed)
According to [facility] law, patient should not drive until seizure-free for 6 months, he should also avoid mechanical operation, ladder climbing or similar activity that would cause danger to himself or surroundings if he has recurrent seizure.  If his job does not involve above-mentioned operations, he may be driven to his work

## 2022-12-07 NOTE — Telephone Encounter (Signed)
Pts son called and stated the pt wanted to work is it possible if he has someone drive him there. I told them I would ask their provider

## 2022-12-07 NOTE — Progress Notes (Signed)
Chief Complaint  Patient presents with   Hospitalization Follow-up    Rm 15, son present Lucas Lin present also  Hospital follow up for seizure      ASSESSMENT AND PLAN  Lucas Lin is a 82 y.o. male   Complex status epilepticus  In the setting of significantly elevated glucose 1 000, DKA, hyperglycemia hyperosmolar syndrome,  Tolerating Keppra 500 mg twice daily well, refilled the prescription,  No driving until seizure-free for 6 months  Emphasized importance of better control of his diabetes, Worsening low back pain, gait abnormality,  MRI of lumbar spine  Evidence of  Diabetic peripheral neuropathy   Length-dependent sensory changes, at risk for foot complications, poor foot hygiene, fungus infection of nail, , suggesting podiatry evaluation,  Abnormal MRI brain  Left parietal calvarium lucency,, patient reported primary care physician Dr. Clovis Lin, Lucas Lin,  is aware of the result,  futher work up is pending  \DIAGNOSTIC DATA (LABS, IMAGING, TESTING) - I reviewed patient records, labs, notes, testing and imaging myself where available.   MEDICAL HISTORY:  Lucas Lin is a 82 year old male, accompanied by his son, seen in request by his primary care physician Dr. Clovis Lin, Lucas Lin for evaluation of seizure, initial evaluation Dec 07, 2022  I reviewed and summarized the referring note.  Past medical history Hypertension Hyperlipidemia Diabetes Seizure  He lives alone, still works full-time as a Midwife, in early May 2024, he began to develop intermittent right shoulder arm jerking movement, thought it was due to his shoulder pathology  On Nov 26, 2022, when his son living in [place] visiting him, noticed his frequent right shoulder jerking movement, but there was no other significant abnormality noted, no loss of consciousness no confusion  2 hours later, when his son came back from his errant, noticed his father was sitting in the sofa, with forceful  right side body jerking, unconsciousness, lasting patient could remember he was trying to change, then wake up at the hospital  Personally reviewed MRI of the brain without contrast, no evidence of acute infarction, mild small vessel disease, possible diffuse restriction lesion in the left parietal calvarium, correlate with the CT head finding of the same day, radiology reads the possibility of metastatic disease  Overnight video EEG monitoring showed no significant abnormality  He was discharged with Keppra 500 mg twice a day, tolerating it well  During hospital admission, he was found to have significantly elevated glucoseGlucose 1000, serum ketone level was elevated, anion gap elevation, pH of 7.3, met the criteria for DKA and HHS, hyperosmolar hyerglycemia syndrome, he is not discharged with insulin shots,  He is back to his home, baseline, chronic low back pain, mild gait abnormality, 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  PHYSICAL EXAMNIATION:  Gen: NAD, conversant, well nourised, well groomed                     Cardiovascular: Regular rate rhythm, no peripheral edema, warm, nontender. Eyes: Conjunctivae clear without exudates or hemorrhage Neck: Supple, no carotid bruits. Pulmonary: Clear to auscultation bilaterally   NEUROLOGICAL EXAM:  MENTAL STATUS: Speech/cognition: Awake, alert, oriented to history taking and casual conversation CRANIAL NERVES: CN II: Visual fields are full to confrontation. Pupils are round equal and briskly reactive to light. CN III, IV, VI: extraocular movement are normal. No ptosis. CN V: Facial sensation is intact to light  touch CN VII: Face is symmetric with normal eye closure  CN VIII: Hearing is normal to causal conversation. CN IX, X: Phonation is normal. CN XI: Head turning and shoulder shrug are intact  MOTOR: There is no pronator drift of out-stretched arms. Muscle bulk and tone are normal. Muscle strength is normal.  REFLEXES: Reflexes are 1 and symmetric at the biceps, triceps, knees, and absent at the ankle ankles. Plantar responses are flexor.  SENSORY: Length-dependent decreased light touch, pinprick, vibratory sensation to distal shin level  COORDINATION: There is no trunk or limb dysmetria noted.  GAIT/STANCE: Need push-up to get up from seated position, scoliosis, antalgic, but steady  REVIEW OF SYSTEMS:  Full 14 system review of systems performed and notable only for as above All other review of systems were negative.   ALLERGIES: Allergies  Allergen Reactions   Doxazosin     Other reaction(s): Cough (ALLERGY/intolerance)   Lisinopril Swelling    HOME MEDICATIONS: Current Outpatient Medications  Medication Sig Dispense Refill   Accu-Chek Softclix Lancets lancets Use 3 (three) times daily. 100 each 0   acetaminophen (TYLENOL) 500 MG tablet Take 2 tablets (1,000 mg total) by mouth every 8 (eight) hours for 5 days. 100 tablet 0   amLODipine (NORVASC) 10 MG tablet Take 10 mg by mouth daily.  3   Blood Glucose Monitoring Suppl (BLOOD GLUCOSE MONITOR SYSTEM) w/Device KIT Use 3 (three) times daily. 1 kit 0   Glucagon (GVOKE HYPOPEN 2-PACK) 1 MG/0.2ML SOAJ Inject 1 mg into the skin as needed for up to 2 doses (Severe low blood sugar). 0.4 mL 0   Glucose Blood (BLOOD GLUCOSE TEST STRIPS) STRP Use 3 (three) times daily as directed to check blood sugar. 100 strip 0   insulin glargine (LANTUS) 100 UNIT/ML Solostar Pen Inject 25 Units into the skin daily. 9 mL 2   insulin lispro (HUMALOG) 100 UNIT/ML KwikPen Inject 6 Units into the skin 3 (three) times daily with meals. Only take if eating  a meal AND Blood Glucose (BG) is 80 or higher. 6 mL 2   Insulin Pen Needle 32G X 4 MM MISC Use 3 (three) times daily. 100 each 0   Lancet Device MISC 1 each by Does not apply route 3 (three) times daily. May dispense any manufacturer covered by patient's insurance. 1 each 0   levETIRAcetam (KEPPRA) 500 MG tablet Take 1 tablet (500 mg total) by mouth every 12 (twelve) hours. 60 tablet 2   No current facility-administered medications for this visit.    PAST MEDICAL HISTORY: Past Medical History:  Diagnosis Date   Hypertension     PAST SURGICAL HISTORY: History reviewed. No pertinent surgical history.  FAMILY HISTORY: History reviewed. No pertinent family history.  SOCIAL HISTORY: Social History   Socioeconomic History   Marital status: Widowed    Spouse name: Not on file   Number of children: 3   Years of education: Not on file   Highest education level: 4th grade  Occupational History   Not on file  Tobacco Use   Smoking status: Former   Smokeless tobacco: Never  Vaping Use   Vaping Use: Never used  Substance and Sexual Activity   Alcohol  use: Not Currently   Drug use: Not Currently   Sexual activity: Not Currently    Birth control/protection: None  Other Topics Concern   Not on file  Social History Narrative   Not on file   Social Determinants of Health   Financial Resource Strain: Not on file  Food Insecurity: No Food Insecurity (11/29/2022)   Hunger Vital Sign    Worried About Running Out of Food in the Last Year: Never true    Ran Out of Food in the Last Year: Never true  Transportation Needs: No Transportation Needs (11/29/2022)   PRAPARE - Administrator, Civil Service (Medical): No    Lack of Transportation (Non-Medical): No  Physical Activity: Not on file  Stress: Not on file  Social Connections: Not on file  Intimate Partner Violence: Not At Risk (11/29/2022)   Humiliation, Afraid, Rape, and Kick questionnaire    Fear of Current or  Ex-Partner: No    Emotionally Abused: No    Physically Abused: No    Sexually Abused: No      Levert Feinstein, M.D. Ph.D.  [facility] [address] [place], [place] [address] Ph: [phone] Fax: [phone]  CC:  Lorin Glass, MD [address] [place],  [place] [address]  Lucas Lin, Lucas Saucer, MD

## 2022-12-08 NOTE — Telephone Encounter (Signed)
Called and relayed information to the pt son who stated that at their work they have ladders and machine operation. I stated that they should avoid that until 6 months. I stated that they also may need to reach out to human resources for fmla paperwork. They voiced gratitude and understanding

## 2022-12-09 ENCOUNTER — Other Ambulatory Visit (HOSPITAL_COMMUNITY): Payer: Self-pay | Admitting: Family Medicine

## 2022-12-09 ENCOUNTER — Encounter: Payer: Self-pay | Admitting: Physical Therapy

## 2022-12-09 ENCOUNTER — Ambulatory Visit: Payer: 59 | Attending: Internal Medicine | Admitting: Physical Therapy

## 2022-12-09 VITALS — BP 136/62 | HR 77

## 2022-12-09 DIAGNOSIS — G40901 Epilepsy, unspecified, not intractable, with status epilepticus: Secondary | ICD-10-CM | POA: Insufficient documentation

## 2022-12-09 DIAGNOSIS — R269 Unspecified abnormalities of gait and mobility: Secondary | ICD-10-CM | POA: Insufficient documentation

## 2022-12-09 DIAGNOSIS — R9389 Abnormal findings on diagnostic imaging of other specified body structures: Secondary | ICD-10-CM

## 2022-12-09 NOTE — Therapy (Signed)
OUTPATIENT PHYSICAL THERAPY NEURO EVALUATION   Patient Name: Lucas Lin MRN: [id number] DOB:03-May-1941, 82 y.o., male Today's Date: 12/09/2022   PCP: Lupe Carney, MD REFERRING PROVIDER: Lorin Glass, MD  END OF SESSION:  PT End of Session - 12/09/22 0800     Visit Number 1    Number of Visits 1    Authorization Type Med Pay    PT Start Time 0800    PT Stop Time 0845    PT Time Calculation (min) 45 min    Equipment Utilized During Treatment Gait belt    Activity Tolerance Patient tolerated treatment well    Behavior During Therapy WFL for tasks assessed/performed             Past Medical History:  Diagnosis Date   Hypertension    History reviewed. No pertinent surgical history. Patient Active Problem List   Diagnosis Date Noted   Partial symptomatic epilepsy with complex partial seizures, not intractable, without status epilepticus (HCC) 12/07/2022   DM type 2 with diabetic peripheral neuropathy (HCC) 12/07/2022   Bilateral low back pain without sciatica 12/07/2022   Status epilepticus (HCC) 11/26/2022    ONSET DATE: 11/30/2022 (referral date)  REFERRING DIAG: Lorin Glass, MD  THERAPY DIAG:  Abnormality of gait and mobility  Rationale for Evaluation and Treatment: Rehabilitation  SUBJECTIVE:                                                                                                                                                                                             SUBJECTIVE STATEMENT: Patient is somewhat confused as to why he is here; he thinks it may be due to his back but son later corrects that it is due to patient having a seizure. Per son, patient has no prior history of seizures. Patient has been compliant with Sheralyn Boatman since being in the hospital; he typically takes medication at 9:00am/9:00pm. Per son, the seizure was due to patient's blood sugar being too elevated. Patient reports that after his seizure he had some memory challenges  but thinks he is back baseline in regards to memory. Son states patient's functional mobility similar to what it was prior to the seizure. Patient presents slumped over the chair with arm "jumping up and down" as presentation of seizure per son. Patient was not using an AD to hold onto before going into the hospital. Patient has an upcoming MRI for his brain but no set date at this time. Patient also has an appointment on the May 23rd for an MRI for patient's back. Son and patient are unsure if patient needs therapy at this time  but would like to see what the testing shows.   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 No  PRECAUTIONS: Fall  WEIGHT BEARING RESTRICTIONS: No  FALLS: Has patient fallen in last 6 months? No  LIVING ENVIRONMENT: Lives with: lives alone Lives in: House/apartment Stairs: Yes: External: 2 steps; can reach both Has following equipment at home: Walker - 2 wheeled, shower chair, and Grab bars  PLOF: Independent - including with driving/managing medications/etc  PATIENT GOALS: "I am not really sure, I feel like I am the same as I was before."   OBJECTIVE:   DIAGNOSTIC FINDINGS:   MRI 11/26/2022: IMPRESSION: 1. Evaluation is somewhat limited as the exam was terminated before obtaining the axial T1 and axial and coronal T2 sequences. Within this limitation, there is no evidence of acute or subacute infarct. 2. Possible diffusion restricting lesion in the left parietal calvarium, which correlates with a lucency on the same day CT head, which is nonspecific but could represent metastatic disease.  COGNITION: Overall cognitive status: Impaired - mild memory challenges   SENSATION: WFL  COORDINATION: WFL  EDEMA:  WFL  MUSCLE TONE: LE tone  grossly WNL  POSTURE: rounded shoulders and forward head  LOWER EXTREMITY ROM:     WFL for tests performed during session; mild tightness noted in hip flexors/hip extensors; sholder flexion limited to ~85 degrees bilaterally  LOWER EXTREMITY MMT:    MMT Right Eval Left Eval  Hip flexion 4-/5 3+/5  Hip extension    Hip abduction    Hip adduction    Hip internal rotation    Hip external rotation    Knee flexion 3+/5 3+/5  Knee extension 4-/5 4-/5  Ankle dorsiflexion 3+/5 3+/5  Ankle plantarflexion    Ankle inversion    Ankle eversion    (Blank rows = not tested)  TRANSFERS: Assistive device utilized: None  Sit to stand: SBA Stand to sit: SBA Chair to chair: SBA  GAIT: Gait pattern: decreased hip/knee flexion- Right, decreased hip/knee flexion- Left, decreased ankle dorsiflexion- Right, decreased ankle dorsiflexion- Left, trendelenburg, and lateral lean- Left Distance walked: 4 x 80 feet Assistive device utilized: None Level of assistance: SBA Comments: When walking into clinic used walls intermittently for contact, left scoliosis   FUNCTIONAL TESTS:  5 times sit to stand: 16.49 sec without UE use Timed up and go (TUG): 14.5 sec without AD 10 meter walk test: 0.76 m/s without AD   PATIENT SURVEYS:  ABC scale  60.6% Confidence  TODAY'S TREATMENT:                                                                                                                               Initial Eval only  PATIENT EDUCATION: Education details: Educated on falls  risk findings as noted above/below, family and patient verbalized understanding of risk, also advised for use of walker at home, patient and family politely refused Person educated: Patient and Child(ren) Education method: Explanation Education comprehension: verbalized understanding  HOME EXERCISE PROGRAM: Not provided - per family request  GOALS: Not set at this time as patient politely declines therapy at this  time  ASSESSMENT:  CLINICAL IMPRESSION: Patient is a 82 y.o. male who was seen today for physical therapy evaluation and treatment for impairments secondary to seizure with PMH of HLD, HTN, hyperosmolar hyperglycemia syndrome, acute abnormality in L parietal calvarium suspicious for metastasis. Patient reports that he is close to functional baseline and does not think he needs therapy but would like to see what testing shows prior to start of session. Based on examination findings, patient is at an increased risk for falls as indicated by ABC score < 67%, TUG, gait speed, and 5xSTS. Therapist explains results to son and patient stating that therapy would be indicated based on falls risk as well as encourages use of walker given furniture surfing noted as patient ambulated into clinic; patient and son politely decline therapy service and use of AD at this time. Per patient preference will not pick up for therapy at this time. Patient and son aware of falls risk.   OBJECTIVE IMPAIRMENTS: Abnormal gait, decreased balance, decreased mobility, difficulty walking, decreased strength, and postural dysfunction.   ACTIVITY LIMITATIONS: carrying, lifting, standing, reach over head, and locomotion level  PARTICIPATION LIMITATIONS: community activity and yard work  PERSONAL FACTORS: Age and 3+ comorbidities: see above  are also affecting patient's functional outcome.   CLINICAL DECISION MAKING: Stable/uncomplicated  EVALUATION COMPLEXITY: Low  PLAN:  Not picking up for therapy per patient/son request  Carmelia Bake, PT, DPT 12/09/2022, 9:11 AM

## 2022-12-15 ENCOUNTER — Other Ambulatory Visit: Payer: 59

## 2022-12-21 ENCOUNTER — Encounter (HOSPITAL_COMMUNITY)
Admission: RE | Admit: 2022-12-21 | Discharge: 2022-12-21 | Disposition: A | Discharge status: Home / Self Care | Payer: 59 | Source: Ambulatory Visit | Attending: Family Medicine | Admitting: Family Medicine

## 2022-12-21 DIAGNOSIS — R9389 Abnormal findings on diagnostic imaging of other specified body structures: Secondary | ICD-10-CM | POA: Insufficient documentation

## 2022-12-21 MED ORDER — TECHNETIUM TC 99M MEDRONATE IV KIT
20.0000 | PACK | Freq: Once | INTRAVENOUS | Status: AC | PRN
  Administered 2022-12-21: 20 via INTRAVENOUS

## 2023-01-16 ENCOUNTER — Other Ambulatory Visit (INDEPENDENT_AMBULATORY_CARE_PROVIDER_SITE_OTHER): Payer: 59

## 2023-01-16 ENCOUNTER — Ambulatory Visit (INDEPENDENT_AMBULATORY_CARE_PROVIDER_SITE_OTHER): Payer: 59 | Admitting: Surgical

## 2023-01-16 DIAGNOSIS — M25511 Pain in right shoulder: Secondary | ICD-10-CM

## 2023-01-16 DIAGNOSIS — M25512 Pain in left shoulder: Secondary | ICD-10-CM

## 2023-01-16 DIAGNOSIS — M19011 Primary osteoarthritis, right shoulder: Secondary | ICD-10-CM | POA: Diagnosis not present

## 2023-01-16 DIAGNOSIS — M19012 Primary osteoarthritis, left shoulder: Secondary | ICD-10-CM

## 2023-01-16 DIAGNOSIS — E1142 Type 2 diabetes mellitus with diabetic polyneuropathy: Secondary | ICD-10-CM | POA: Diagnosis not present

## 2023-01-17 LAB — HEMOGLOBIN A1C
Hgb A1c MFr Bld: 8.9 % of total Hgb — ABNORMAL HIGH (ref <5.7)
Mean Plasma Glucose: 209 mg/dL
eAG (mmol/L): 11.6 mmol/L

## 2023-01-18 NOTE — Progress Notes (Signed)
A1c at 8.9. can you call and tell him we can do cortisone injections in the shoulder when the A1c is around 8.0 or when his blood glucose is consistently between 100-130 at all times when he checks

## 2023-01-19 ENCOUNTER — Encounter: Payer: Self-pay | Admitting: Surgical

## 2023-01-19 ENCOUNTER — Telehealth: Payer: Self-pay

## 2023-01-19 MED ORDER — BUPIVACAINE HCL 0.25 % IJ SOLN
9.0000 mL | INTRAMUSCULAR | Status: AC | PRN
Start: 2023-01-16 — End: 2023-01-16
  Administered 2023-01-16: 9 mL via INTRA_ARTICULAR

## 2023-01-19 MED ORDER — LIDOCAINE HCL 1 % IJ SOLN
5.0000 mL | INTRAMUSCULAR | Status: AC | PRN
Start: 2023-01-16 — End: 2023-01-16
  Administered 2023-01-16: 5 mL

## 2023-01-19 NOTE — Telephone Encounter (Signed)
-----   Message from Julieanne Cotton, PA-C sent at 01/18/2023  5:24 PM EDT ----- A1c at 8.9. can you call and tell him we can do cortisone injections in the shoulder when the A1c is around 8.0 or when his blood glucose is consistently between 100-130 at all times when he checks

## 2023-01-19 NOTE — Telephone Encounter (Signed)
IC LMVM advising per Luke.  

## 2023-01-19 NOTE — Progress Notes (Signed)
Office Visit Note   Patient: Lucas Lin           Date of Birth: 1940/10/04           MRN: [id number] Visit Date: 01/16/2023 Requested by: Clovis Riley, [name], MD [address] [place],  [place] [address] PCP: Clovis Riley, [name], MD  Subjective: Chief Complaint  Patient presents with   Left Shoulder - Pain   Right Shoulder - Pain    HPI: Lucas Lin is a 82 y.o. male who presents to the office reporting bilateral shoulder pain.  Has had increased shoulder pain over the last 6 months with the right shoulder bothering him more than the left.  No recent injury.  Does have history of end-stage rotator cuff arthropathy arthritis with prior injections by Dr. August Saucer several years ago.  Does have history of diabetes with last A1c 12.9.  He has no numbness or tingling or radiation of pain.  All the pain is centered around the anterolateral and lateral aspects of the deltoid region.  Does have a lot of crepitus when he moves his shoulder.  Previous injections have given him about 6 months of relief for 75% relief and he would like to repeat this..                ROS: All systems reviewed are negative as they relate to the chief complaint within the history of present illness.  Patient denies fevers or chills.  Assessment & Plan: Visit Diagnoses:  1. Acute pain of right shoulder   2. Left shoulder pain, unspecified chronicity   3. DM type 2 with diabetic peripheral neuropathy [facility])     Plan: Impression is 82 year old male with bilateral shoulder end-stage arthritis due to history of rotator cuff arthropathy.  Has significant crepitus with motion of the shoulders and severely limited range of motion as well as weakness of the rotator cuff on exam today.  Prior injections have given him great relief but his last A1c was 12.9 when he was hospitalized.  He says that his blood glucose is trending down and he has been keeping good control of it.  A1c was rechecked today and found to  be 8.9.  We administered bilateral glenohumeral Toradol injections with combination of 9 cc bupivacaine mixed with 1 cc Toradol into each shoulder.  Tolerated injection well.  Suspect this will be short-lived as far as the relief he gets from this but once his A1c is better controlled we can try cortisone injections which have given him about 6 months of relief.  Over the last 2 months his A1c has come down from 12.9-8.9 so if he continues his current diabetes control, suspect we could do cortisone injections in about 4 weeks.  Follow-Up Instructions: No follow-ups on file.   Orders:  Orders Placed This Encounter  Procedures   XR Shoulder Right   XR Shoulder Left   Hemoglobin A1C   No orders of the defined types were placed in this encounter.     Procedures: Large Joint Inj: bilateral glenohumeral on 01/16/2023 2:16 PM Indications: diagnostic evaluation and pain Details: 18 G 1.5 in needle, posterior approach  Arthrogram: No  Medications (Right): 5 mL lidocaine 1 %; 9 mL bupivacaine 0.25 % Medications (Left): 5 mL lidocaine 1 %; 9 mL bupivacaine 0.25 % Outcome: tolerated well, no immediate complications  9 cc bupivacaine mixed with 1 cc Toradol for each of the glenohumeral injections Procedure, treatment alternatives, risks and benefits explained, specific  risks discussed. Consent was given by the patient. Immediately prior to procedure a time out was called to verify the correct patient, procedure, equipment, support staff and site/side marked as required. Patient was prepped and draped in the usual sterile fashion.       Clinical Data: No additional findings.  Objective: Vital Signs: There were no vitals taken for this visit.  Physical Exam:  Constitutional: Patient appears well-developed HEENT:  Head: Normocephalic Eyes:EOM are normal Neck: Normal range of motion Cardiovascular: Normal rate Pulmonary/chest: Effort normal Neurologic: Patient is alert Skin: Skin is  warm Psychiatric: Patient has normal mood and affect  Ortho Exam: Ortho exam demonstrates left shoulder with 35 degrees X rotation, 80 degrees abduction, 90 degrees forward elevation passively and actively.  This compared with the right shoulder with 40 degrees external rotation, 45 degrees abduction, 100 degrees forward elevation passively and actively.  Has weakness of external rotation rated 3/5 bilaterally with subscapularis strength rated 4/5 on the right and 5 -/5 on the left.  No tenderness over the [facility] joint.  Mild tenderness over the bicipital groove bilaterally.  Axillary nerve intact with deltoid firing.  Intact EPL, FPL, finger abduction bilaterally.  2+ radial pulse of the bilateral upper extremities.  Specialty Comments:  No specialty comments available.  Imaging: No results found.   PMFS History: Patient Active Problem List   Diagnosis Date Noted   Partial symptomatic epilepsy with complex partial seizures, not intractable, without status epilepticus (HCC) 12/07/2022   DM type 2 with diabetic peripheral neuropathy (HCC) 12/07/2022   Bilateral low back pain without sciatica 12/07/2022   Status epilepticus (HCC) 11/26/2022   Past Medical History:  Diagnosis Date   Hypertension     No family history on file.  No past surgical history on file. Social History   Occupational History   Not on file  Tobacco Use   Smoking status: Former   Smokeless tobacco: Never  Building services engineer Use: Never used  Substance and Sexual Activity   Alcohol use: Not Currently   Drug use: Not Currently   Sexual activity: Not Currently    Birth control/protection: None

## 2023-01-30 ENCOUNTER — Telehealth: Payer: Self-pay | Admitting: Surgical

## 2023-01-30 NOTE — Telephone Encounter (Signed)
Patient called. Would like Lauren to call him.

## 2023-01-31 NOTE — Telephone Encounter (Signed)
Tried calling to talk with patient, no answer. Not sure what patient is needing, would be helpful to have additional information when patient returns call.

## 2023-06-05 NOTE — Progress Notes (Deleted)
No chief complaint on file.     ASSESSMENT AND PLAN  Lucas Lin is a 82 y.o. male   Complex status epilepticus  In the setting of significantly elevated glucose 1 000, DKA, hyperglycemia hyperosmolar syndrome,  Tolerating Keppra 500 mg twice daily well, refilled the prescription,  No driving until seizure-free for 6 months  Emphasized importance of better control of his diabetes,  Worsening low back pain, gait abnormality,  MRI of lumbar spine - not yet completed, advised to contact GI to schedule  Evidence of  Diabetic peripheral neuropathy   Length-dependent sensory changes, at risk for foot complications, poor foot hygiene, fungus infection of nail, , suggesting podiatry evaluation,  Abnormal MRI brain  Left parietal calvarium lucency,, patient reported primary care physician Dr. Clovis Riley, [name],  is aware of the result,  futher work up is pending     \DIAGNOSTIC DATA (LABS, IMAGING, TESTING) - I reviewed patient records, labs, notes, testing and imaging myself where available.   MEDICAL HISTORY:   Update 06/06/2023 JM: Patient returns for follow-up visit.  Denies any recurrent seizure activity, remains on levetiracetam 500 mg twice daily, tolerating without side effects.      Consult visit 12/07/2022 Dr. Terrace Arabia: Lucas Lin is a 82 year old male, accompanied by his son, seen in request by his primary care physician Dr. Clovis Riley, L.Dean for evaluation of seizure, initial evaluation Dec 07, 2022  I reviewed and summarized the referring note.  Past medical history Hypertension Hyperlipidemia Diabetes Seizure  He lives alone, still works full-time as a Midwife, in early May 2024, he began to develop intermittent right shoulder arm jerking movement, thought it was due to his shoulder pathology  On Nov 26, 2022, when his son living in [place] visiting him, noticed his frequent right shoulder jerking movement, but there was no other significant  abnormality noted, no loss of consciousness no confusion  2 hours later, when his son came back from his errant, noticed his father was sitting in the sofa, with forceful right side body jerking, unconsciousness, lasting patient could remember he was trying to change, then wake up at the hospital  Personally reviewed MRI of the brain without contrast, no evidence of acute infarction, mild small vessel disease, possible diffuse restriction lesion in the left parietal calvarium, correlate with the CT head finding of the same day, radiology reads the possibility of metastatic disease  Overnight video EEG monitoring showed no significant abnormality  He was discharged with Keppra 500 mg twice a day, tolerating it well  During hospital admission, he was found to have significantly elevated glucoseGlucose 1000, serum ketone level was elevated, anion gap elevation, pH of 7.3, met the criteria for DKA and HHS, hyperosmolar hyerglycemia syndrome, he is not discharged with insulin shots,  He is back to his home, baseline, chronic low back pain, mild gait abnormality, hope to go back to his job as a Midwife, He had long history of low back pain, tried and failed many years of intermittent NSAIDs, back stretching, Tylenol, desire further evaluation     PHYSICAL EXAM:   There were no vitals filed for this visit.  Not recorded     There is no height or weight on file to calculate BMI.  PHYSICAL EXAMNIATION:  Gen: NAD, conversant, well nourised, well groomed                     Cardiovascular: Regular rate rhythm, no peripheral edema, warm, nontender. Eyes: Conjunctivae clear without  exudates or hemorrhage Neck: Supple, no carotid bruits. Pulmonary: Clear to auscultation bilaterally   NEUROLOGICAL EXAM:  MENTAL STATUS: Speech/cognition: Awake, alert, oriented to history taking and casual conversation CRANIAL NERVES: CN II: Visual fields are full to confrontation. Pupils are round equal and  briskly reactive to light. CN III, IV, VI: extraocular movement are normal. No ptosis. CN V: Facial sensation is intact to light touch CN VII: Face is symmetric with normal eye closure  CN VIII: Hearing is normal to causal conversation. CN IX, X: Phonation is normal. CN XI: Head turning and shoulder shrug are intact  MOTOR: There is no pronator drift of out-stretched arms. Muscle bulk and tone are normal. Muscle strength is normal.  REFLEXES: Reflexes are 1 and symmetric at the biceps, triceps, knees, and absent at the ankle ankles. Plantar responses are flexor.  SENSORY: Length-dependent decreased light touch, pinprick, vibratory sensation to distal shin level  COORDINATION: There is no trunk or limb dysmetria noted.  GAIT/STANCE: Need push-up to get up from seated position, scoliosis, antalgic, but steady  REVIEW OF SYSTEMS:  Full 14 system review of systems performed and notable only for as above All other review of systems were negative.   ALLERGIES: Allergies  Allergen Reactions   Doxazosin     Other reaction(s): Cough (ALLERGY/intolerance)   Lisinopril Swelling    HOME MEDICATIONS: Current Outpatient Medications  Medication Sig Dispense Refill   Accu-Chek Softclix Lancets lancets Use 3 (three) times daily. 100 each 0   amLODipine (NORVASC) 10 MG tablet Take 10 mg by mouth daily.  3   Blood Glucose Monitoring Suppl (BLOOD GLUCOSE MONITOR SYSTEM) w/Device KIT Use 3 (three) times daily. 1 kit 0   Glucagon (GVOKE HYPOPEN 2-PACK) 1 MG/0.2ML SOAJ Inject 1 mg into the skin as needed for up to 2 doses (Severe low blood sugar). 0.4 mL 0   Glucose Blood (BLOOD GLUCOSE TEST STRIPS) STRP Use 3 (three) times daily as directed to check blood sugar. 100 strip 0   insulin glargine (LANTUS) 100 UNIT/ML Solostar Pen Inject 25 Units into the skin daily. 9 mL 2   insulin lispro (HUMALOG) 100 UNIT/ML KwikPen Inject 6 Units into the skin 3 (three) times daily with meals. Only take if  eating a meal AND Blood Glucose (BG) is 80 or higher. 6 mL 2   Insulin Pen Needle 32G X 4 MM MISC Use 3 (three) times daily. 100 each 0   Lancet Device MISC 1 each by Does not apply route 3 (three) times daily. May dispense any manufacturer covered by patient's insurance. 1 each 0   levETIRAcetam (KEPPRA) 500 MG tablet Take 1 tablet (500 mg total) by mouth every 12 (twelve) hours. 180 tablet 3   No current facility-administered medications for this visit.    PAST MEDICAL HISTORY: Past Medical History:  Diagnosis Date   Hypertension     PAST SURGICAL HISTORY: No past surgical history on file.  FAMILY HISTORY: No family history on file.  SOCIAL HISTORY: Social History   Socioeconomic History   Marital status: Widowed    Spouse name: Not on file   Number of children: 3   Years of education: Not on file   Highest education level: 4th grade  Occupational History   Not on file  Tobacco Use   Smoking status: Former   Smokeless tobacco: Never  Vaping Use   Vaping status: Never Used  Substance and Sexual Activity   Alcohol use: Not Currently   Drug use: Not  Currently   Sexual activity: Not Currently    Birth control/protection: None  Other Topics Concern   Not on file  Social History Narrative   Not on file   Social Determinants of Health   Financial Resource Strain: Not on file  Food Insecurity: No Food Insecurity (11/29/2022)   Hunger Vital Sign    Worried About Running Out of Food in the Last Year: Never true    Ran Out of Food in the Last Year: Never true  Transportation Needs: No Transportation Needs (11/29/2022)   PRAPARE - Administrator, Civil Service (Medical): No    Lack of Transportation (Non-Medical): No  Physical Activity: Not on file  Stress: Not on file  Social Connections: Not on file  Intimate Partner Violence: Not At Risk (11/29/2022)   Humiliation, Afraid, Rape, and Kick questionnaire    Fear of Current or Ex-Partner: No    Emotionally  Abused: No    Physically Abused: No    Sexually Abused: No     I spent *** minutes of face-to-face and non-face-to-face time with patient.  This included previsit chart review, lab review, study review, order entry, electronic health record documentation, patient education and discussion regarding above diagnoses and treatment plan and answered all other questions to patient's satisfaction  Ihor Austin, [facility]  [facility] [address] [place], [place] [address]  Phone [phone] Fax [phone] Note: This document was prepared with digital dictation and possible smart phrase technology. Any transcriptional errors that result from this process are unintentional.

## 2023-06-06 ENCOUNTER — Ambulatory Visit: Payer: 59 | Admitting: Adult Health

## 2023-06-06 ENCOUNTER — Encounter: Payer: Self-pay | Admitting: Adult Health

## 2023-09-29 ENCOUNTER — Other Ambulatory Visit: Payer: Self-pay

## 2023-09-29 ENCOUNTER — Encounter: Payer: Self-pay | Admitting: Surgical

## 2023-09-29 ENCOUNTER — Ambulatory Visit: Payer: 59 | Admitting: Surgical

## 2023-09-29 DIAGNOSIS — M19011 Primary osteoarthritis, right shoulder: Secondary | ICD-10-CM | POA: Diagnosis not present

## 2023-09-29 DIAGNOSIS — M19012 Primary osteoarthritis, left shoulder: Secondary | ICD-10-CM

## 2023-09-29 MED ORDER — LIDOCAINE HCL 1 % IJ SOLN
5.0000 mL | INTRAMUSCULAR | Status: AC | PRN
Start: 2023-09-29 — End: 2023-09-29
  Administered 2023-09-29: 5 mL

## 2023-09-29 MED ORDER — METHYLPREDNISOLONE ACETATE 40 MG/ML IJ SUSP
40.0000 mg | INTRAMUSCULAR | Status: AC | PRN
Start: 2023-09-29 — End: 2023-09-29
  Administered 2023-09-29: 40 mg via INTRA_ARTICULAR

## 2023-09-29 MED ORDER — BUPIVACAINE HCL 0.25 % IJ SOLN
9.0000 mL | INTRAMUSCULAR | Status: AC | PRN
Start: 2023-09-29 — End: 2023-09-29
  Administered 2023-09-29: 9 mL via INTRA_ARTICULAR

## 2023-09-29 NOTE — Progress Notes (Signed)
   Procedure Note  Patient: Lucas Lin             Date of Birth: 10/29/1940           MRN: [id number]             Visit Date: 09/29/2023  Procedures: Visit Diagnoses:  1. Bilateral shoulder region arthritis     Large Joint Inj: R glenohumeral on 09/29/2023 4:41 PM Details: 22 G 3.5 in needle, ultrasound-guided posterior approach Medications: 5 mL lidocaine 1 %; 9 mL bupivacaine 0.25 %; 40 mg methylPREDNISolone acetate 40 MG/ML Outcome: tolerated well, no immediate complications  Patient returns.  Has history of end-stage shoulder arthritis.  Is here today to try cortisone injection.  Had Toradol injection last year that did well for him.  A1c was checked today and found to be 7.6.  We can do the right shoulder today and he will return for evaluation and possible injection in his right knee. Procedure, treatment alternatives, risks and benefits explained, specific risks discussed. Consent was given by the patient. Patient was prepped and draped in the usual sterile fashion.

## 2023-10-13 ENCOUNTER — Encounter: Payer: Self-pay | Admitting: Surgical

## 2023-10-13 ENCOUNTER — Other Ambulatory Visit (INDEPENDENT_AMBULATORY_CARE_PROVIDER_SITE_OTHER): Payer: Self-pay

## 2023-10-13 ENCOUNTER — Ambulatory Visit (INDEPENDENT_AMBULATORY_CARE_PROVIDER_SITE_OTHER): Admitting: Surgical

## 2023-10-13 DIAGNOSIS — G8929 Other chronic pain: Secondary | ICD-10-CM | POA: Diagnosis not present

## 2023-10-13 DIAGNOSIS — M25561 Pain in right knee: Secondary | ICD-10-CM

## 2023-10-13 DIAGNOSIS — M1711 Unilateral primary osteoarthritis, right knee: Secondary | ICD-10-CM | POA: Diagnosis not present

## 2023-10-15 ENCOUNTER — Encounter: Payer: Self-pay | Admitting: Surgical

## 2023-10-15 MED ORDER — LIDOCAINE HCL 1 % IJ SOLN
5.0000 mL | INTRAMUSCULAR | Status: AC | PRN
  Administered 2023-10-13: 5 mL

## 2023-10-15 MED ORDER — METHYLPREDNISOLONE ACETATE 40 MG/ML IJ SUSP
40.0000 mg | INTRAMUSCULAR | Status: AC | PRN
Start: 2023-10-13 — End: 2023-10-13
  Administered 2023-10-13: 40 mg via INTRA_ARTICULAR

## 2023-10-15 MED ORDER — BUPIVACAINE HCL 0.25 % IJ SOLN
4.0000 mL | INTRAMUSCULAR | Status: AC | PRN
Start: 2023-10-13 — End: 2023-10-13
  Administered 2023-10-13: 4 mL via INTRA_ARTICULAR

## 2023-10-15 NOTE — Progress Notes (Signed)
 Office Visit Note   Patient: Lucas Lin           Date of Birth: 10/28/1940           MRN: [id number] Visit Date: 10/13/2023 Requested by: No referring provider defined for this encounter. PCP: Clovis Riley, [name], MD (Inactive)  Subjective: Chief Complaint  Patient presents with   Right Knee - Pain    HPI: Lucas Lin is a 83 y.o. male who presents to the office reporting right knee pain.  Pain has been going on for 3 months.  Hurts more with activity.  Localizes pain to the anterior aspect of the knee.  No history of injury.  Has not really had knee pain before 3 months ago.  Gets better with rest.  He notes stiffness and decreased range of motion.  No popping sensation.  No prior surgery.  Does not take any pain medication for this.  No groin pain or radicular pain..                ROS: All systems reviewed are negative as they relate to the chief complaint within the history of present illness.  Patient denies fevers or chills.  Assessment & Plan: Visit Diagnoses:  1. Chronic pain of right knee     Plan: Patient is an 83 year old male who presents for evaluation of right knee pain.  Has had pain over the last 3 months without history of injury.  Radiographs demonstrate right knee arthritis with worst degenerative changes noted in the lateral compartment.  He would like to try cortisone injection which gave him great relief for his shoulder pain from recent visit.  Cortisone injection administered and patient tolerated procedure without complication.  He will let us know how this does for him and if this helps we can repeat this in 3 to 4 months.  Follow-up as needed.  Follow-Up Instructions: No follow-ups on file.   Orders:  Orders Placed This Encounter  Procedures   XR KNEE 3 VIEW RIGHT   No orders of the defined types were placed in this encounter.     Procedures: Large Joint Inj: R knee on 10/13/2023 3:02 PM Indications: diagnostic evaluation, joint swelling  and pain Details: 18 G 1.5 in needle, superolateral approach  Arthrogram: No  Medications: 5 mL lidocaine 1 %; 40 mg methylPREDNISolone acetate 40 MG/ML; 4 mL bupivacaine 0.25 % Outcome: tolerated well, no immediate complications Procedure, treatment alternatives, risks and benefits explained, specific risks discussed. Consent was given by the patient. Immediately prior to procedure a time out was called to verify the correct patient, procedure, equipment, support staff and site/side marked as required. Patient was prepped and draped in the usual sterile fashion.       Clinical Data: No additional findings.  Objective: Vital Signs: There were no vitals taken for this visit.  Physical Exam:  Constitutional: Patient appears well-developed HEENT:  Head: Normocephalic Eyes:EOM are normal Neck: Normal range of motion Cardiovascular: Normal rate Pulmonary/chest: Effort normal Neurologic: Patient is alert Skin: Skin is warm Psychiatric: Patient has normal mood and affect  Ortho Exam: Ortho exam demonstrates right knee with 3 degrees extension and 105 degrees of knee flexion.  Trace effusion noted.  Tenderness over the medial joint line mildly and lateral joint line moderately.  There is no cellulitis or skin changes noted.  Palpable DP pulse of the right lower extremity.  Able to perform straight leg raise without extensor lag.  Stable to varus and valgus stress  at 0 and 30 degrees.  Stable to anterior and posterior drawer sign.  No pain with hip range of motion.  Specialty Comments:  No specialty comments available.  Imaging: No results found.   PMFS History: Patient Active Problem List   Diagnosis Date Noted   Partial symptomatic epilepsy with complex partial seizures, not intractable, without status epilepticus (HCC) 12/07/2022   DM type 2 with diabetic peripheral neuropathy (HCC) 12/07/2022   Bilateral low back pain without sciatica 12/07/2022   Status epilepticus (HCC)  11/26/2022   Past Medical History:  Diagnosis Date   Hypertension     No family history on file.  History reviewed. No pertinent surgical history. Social History   Occupational History   Not on file  Tobacco Use   Smoking status: Former   Smokeless tobacco: Never  Vaping Use   Vaping status: Never Used  Substance and Sexual Activity   Alcohol use: Not Currently   Drug use: Not Currently   Sexual activity: Not Currently    Birth control/protection: None

## 2023-11-17 ENCOUNTER — Other Ambulatory Visit (INDEPENDENT_AMBULATORY_CARE_PROVIDER_SITE_OTHER): Payer: Self-pay

## 2023-11-17 ENCOUNTER — Telehealth: Payer: Self-pay | Admitting: Radiology

## 2023-11-17 ENCOUNTER — Ambulatory Visit: Admitting: Surgical

## 2023-11-17 DIAGNOSIS — M545 Low back pain, unspecified: Secondary | ICD-10-CM

## 2023-11-17 DIAGNOSIS — M4807 Spinal stenosis, lumbosacral region: Secondary | ICD-10-CM

## 2023-11-17 DIAGNOSIS — G8929 Other chronic pain: Secondary | ICD-10-CM

## 2023-11-17 NOTE — Telephone Encounter (Signed)
 Please obtain authorization for right knee gel injection -Franky Macho

## 2023-11-19 ENCOUNTER — Encounter: Payer: Self-pay | Admitting: Surgical

## 2023-11-19 NOTE — Progress Notes (Signed)
 Follow-up Office Visit Note   Patient: DULCE WOJTAS           Date of Birth: May 25, 1941           MRN: [id number] Visit Date: 11/17/2023 Requested by: No referring provider defined for this encounter. PCP: Brenita Callow, L.Rozelle Corning, MD (Inactive)  Subjective: Chief Complaint  Patient presents with   Lower Back - Pain    HPI: ALVAH TUOMI is a 83 y.o. male who returns to the office for follow-up visit.    Plan at last visit was: Patient is an 83 year old male who presents for evaluation of right knee pain. Has had pain over the last 3 months without history of injury. Radiographs demonstrate right knee arthritis with worst degenerative changes noted in the lateral compartment. He would like to try cortisone injection which gave him great relief for his shoulder pain from recent visit. Cortisone injection administered and patient tolerated procedure without complication. He will let us  know how this does for him and if this helps we can repeat this in 3 to 4 months. Follow-up as needed.   Since then, patient notes right knee injection lasted for about 2 weeks before wearing off.  He would like to try different intervention for his right knee pain.  His main complaint today however is chronic low back pain.  No recent injury.  He describes pain primarily across the low back in the axial lumbar spine with some radiation into the right and left paraspinal region.  He does not have any radicular leg pain or numbness/tingling.  No red flag signs or symptoms such as bowel/bladder incontinence or saddle anesthesia.  No radiation into his buttock.  No history of prior back surgery.  Takes Tylenol  without any relief.  Does not use any blood thinners, just taking baby aspirin.  He has no exercise program that he keeps up with.  Still working.  Has had prior ESI's long ago that gave him good relief in the past.  He would like to try and repeat these at some point in the future.  Last injection he had was  facet joint injection in July 2018 with Dr. Daisey Dryer.              ROS: All systems reviewed are negative as they relate to the chief complaint within the history of present illness.  Patient denies fevers or chills.  Assessment & Plan: Visit Diagnoses:  1. Chronic bilateral low back pain, unspecified whether sciatica present   2. Spinal stenosis of lumbosacral region     Plan: DEMETREUS SARVIS is a 83 y.o. male who returns to the office for follow-up visit.  Plan from last visit was noted above in HPI.  They now return with return of right knee pain.  Injection only lasted for about 2 weeks.  He would like to try gel injection.  We will set this up for him.  He also complains of low back pain.  He has chronic low back pain that is not causing any weakness or radicular leg pain.  He has had prior ESI's and facet joint injections with Dr. Daisey Dryer with last injection in 2018 that gave him great relief.  He would like to repeat these.  Will plan for MRI of the lumbar spine to evaluate his chronic low back pain and evaluate any new changes compared with previous MRI as well as optimal level to try facet joint injection based on the scan.  Follow-up after MRI to  review results and we will set him up for facet joint injection at that point.  This patient is diagnosed with osteoarthritis of the knee(s).    Radiographs show evidence of joint space narrowing, osteophytes, subchondral sclerosis and/or subchondral cysts.  This patient has knee pain which interferes with functional and activities of daily living.    This patient has experienced inadequate response, adverse effects and/or intolerance with conservative treatments such as acetaminophen , NSAIDS, topical creams, physical therapy or regular exercise, knee bracing and/or weight loss.   This patient has experienced inadequate response or has a contraindication to intra articular steroid injections for at least 3 months.   This patient is not  scheduled to have a total knee replacement within 6 months of starting treatment with viscosupplementation.   Follow-Up Instructions: No follow-ups on file.   Orders:  Orders Placed This Encounter  Procedures   XR Lumbar Spine 2-3 Views   MR Lumbar Spine w/o contrast   No orders of the defined types were placed in this encounter.     Procedures: No procedures performed   Clinical Data: No additional findings.  Objective: Vital Signs: There were no vitals taken for this visit.  Physical Exam:  Constitutional: Patient appears well-developed HEENT:  Head: Normocephalic Eyes:EOM are normal Neck: Normal range of motion Cardiovascular: Normal rate Pulmonary/chest: Effort normal Neurologic: Patient is alert Skin: Skin is warm Psychiatric: Patient has normal mood and affect  Ortho Exam: Ortho exam demonstrates intact hip flexion, quadricep, hamstring, dorsiflexion, plantarflexion rated 5/5 bilaterally.  No clonus noted bilaterally.  No pain with hip range of motion bilaterally.  He has tenderness throughout the axial lumbar spine at pretty much every level.  Less tenderness at the paraspinal region alongside the lumbar spine.  No tenderness over the SI joints bilaterally.  Specialty Comments:  No specialty comments available.  Imaging: No results found.   PMFS History: Patient Active Problem List   Diagnosis Date Noted   Partial symptomatic epilepsy with complex partial seizures, not intractable, without status epilepticus (HCC) 12/07/2022   DM type 2 with diabetic peripheral neuropathy (HCC) 12/07/2022   Bilateral low back pain without sciatica 12/07/2022   Status epilepticus (HCC) 11/26/2022   Past Medical History:  Diagnosis Date   Hypertension     No family history on file.  No past surgical history on file. Social History   Occupational History   Not on file  Tobacco Use   Smoking status: Former   Smokeless tobacco: Never  Vaping Use   Vaping status:  Never Used  Substance and Sexual Activity   Alcohol use: Not Currently   Drug use: Not Currently   Sexual activity: Not Currently    Birth control/protection: None

## 2023-11-29 NOTE — Telephone Encounter (Signed)
 VOB submitted for Durolane, right knee.

## 2023-12-05 ENCOUNTER — Encounter: Payer: Self-pay | Admitting: Surgical

## 2023-12-12 ENCOUNTER — Inpatient Hospital Stay: Admission: RE | Admit: 2023-12-12 | Source: Ambulatory Visit

## 2023-12-17 ENCOUNTER — Ambulatory Visit
Admission: RE | Admit: 2023-12-17 | Discharge: 2023-12-17 | Disposition: A | Discharge status: Home / Self Care | Source: Ambulatory Visit | Attending: Surgical | Admitting: Surgical

## 2023-12-17 DIAGNOSIS — G8929 Other chronic pain: Secondary | ICD-10-CM

## 2023-12-17 DIAGNOSIS — M4807 Spinal stenosis, lumbosacral region: Secondary | ICD-10-CM

## 2023-12-17 DIAGNOSIS — M545 Low back pain, unspecified: Secondary | ICD-10-CM

## 2023-12-25 ENCOUNTER — Ambulatory Visit: Admitting: Surgical

## 2024-01-09 ENCOUNTER — Ambulatory Visit: Payer: Self-pay | Admitting: Surgical

## 2024-01-09 NOTE — Progress Notes (Signed)
 Can we set Jaceion up for facet joint vs ESI injection with Dr Daisey Dryer? We got updated scan and he wants to try this based on last visit, no radic symptoms

## 2024-01-10 ENCOUNTER — Other Ambulatory Visit: Payer: Self-pay | Admitting: Radiology

## 2024-01-10 DIAGNOSIS — M4807 Spinal stenosis, lumbosacral region: Secondary | ICD-10-CM

## 2024-01-10 NOTE — Progress Notes (Signed)
 Referral placed.

## 2024-02-15 ENCOUNTER — Ambulatory Visit (INDEPENDENT_AMBULATORY_CARE_PROVIDER_SITE_OTHER): Admitting: Physical Medicine and Rehabilitation

## 2024-02-15 ENCOUNTER — Encounter: Payer: Self-pay | Admitting: Physical Medicine and Rehabilitation

## 2024-02-15 DIAGNOSIS — M545 Low back pain, unspecified: Secondary | ICD-10-CM | POA: Diagnosis not present

## 2024-02-15 DIAGNOSIS — G8929 Other chronic pain: Secondary | ICD-10-CM

## 2024-02-15 DIAGNOSIS — M47816 Spondylosis without myelopathy or radiculopathy, lumbar region: Secondary | ICD-10-CM | POA: Diagnosis not present

## 2024-02-15 DIAGNOSIS — M47817 Spondylosis without myelopathy or radiculopathy, lumbosacral region: Secondary | ICD-10-CM

## 2024-02-15 NOTE — Progress Notes (Signed)
 Pain Scale   Average Pain 7 Patient advised he has chronic back pain, patient advised his pain is constant. Patient here for MRI review.        +Driver, -BT, -Dye Allergies.
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
## 2024-02-20 ENCOUNTER — Other Ambulatory Visit: Payer: Self-pay

## 2024-02-20 DIAGNOSIS — M1711 Unilateral primary osteoarthritis, right knee: Secondary | ICD-10-CM

## 2024-03-04 ENCOUNTER — Encounter: Payer: Self-pay | Admitting: Surgical

## 2024-03-04 ENCOUNTER — Ambulatory Visit: Admitting: Surgical

## 2024-03-04 DIAGNOSIS — M1711 Unilateral primary osteoarthritis, right knee: Secondary | ICD-10-CM

## 2024-03-04 MED ORDER — BUPIVACAINE HCL 0.25 % IJ SOLN
4.0000 mL | INTRAMUSCULAR | Status: AC | PRN
  Administered 2024-03-04 (×2): 4 mL via INTRA_ARTICULAR

## 2024-03-04 MED ORDER — LIDOCAINE HCL 1 % IJ SOLN
5.0000 mL | INTRAMUSCULAR | Status: AC | PRN
  Administered 2024-03-04 (×2): 5 mL

## 2024-03-04 MED ORDER — TRIAMCINOLONE ACETONIDE 40 MG/ML IJ SUSP
40.0000 mg | INTRAMUSCULAR | Status: AC | PRN
  Administered 2024-03-04 (×2): 40 mg via INTRA_ARTICULAR

## 2024-03-04 NOTE — Progress Notes (Signed)
   Procedure Note  Patient: Lucas Lin             Date of Birth: [birth year]           MRN: [id number]             Visit Date: 03/04/2024  Procedures: Visit Diagnoses:  1. Unilateral primary osteoarthritis, right knee     Large Joint Inj: R knee on 03/04/2024 4:00 PM Indications: diagnostic evaluation, joint swelling and pain Details: 18 G 1.5 in needle, superolateral approach  Arthrogram: No  Medications: 5 mL lidocaine  1 %; 4 mL bupivacaine  0.25 %; 40 mg triamcinolone  acetonide 40 MG/ML Aspirate: 4 mL Outcome: tolerated well, no immediate complications Procedure, treatment alternatives, risks and benefits explained, specific risks discussed. Consent was given by the patient. Immediately prior to procedure a time out was called to verify the correct patient, procedure, equipment, support staff and site/side marked as required. Patient was prepped and draped in the usual sterile fashion.

## 2024-03-20 ENCOUNTER — Other Ambulatory Visit (HOSPITAL_COMMUNITY): Payer: Self-pay

## 2024-03-27 ENCOUNTER — Ambulatory Visit (INDEPENDENT_AMBULATORY_CARE_PROVIDER_SITE_OTHER): Admitting: Physical Medicine and Rehabilitation

## 2024-03-27 ENCOUNTER — Other Ambulatory Visit: Payer: Self-pay

## 2024-03-27 VITALS — BP 166/79 | HR 73

## 2024-03-27 DIAGNOSIS — M47816 Spondylosis without myelopathy or radiculopathy, lumbar region: Secondary | ICD-10-CM | POA: Diagnosis not present

## 2024-03-27 MED ORDER — METHYLPREDNISOLONE ACETATE 40 MG/ML IJ SUSP
40.0000 mg | Freq: Once | INTRAMUSCULAR | Status: AC
  Administered 2024-03-27: 40 mg

## 2024-03-27 NOTE — Progress Notes (Signed)
 Pain Scale   Average Pain 9 Patient advising he has lower back pain radiating to right hip and pain is constant        +Driver, -BT, -Dye Allergies.
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
## 2024-03-27 NOTE — Procedures (Signed)
 Lumbar Facet Joint Intra-Articular Injection(s) with Fluoroscopic Guidance  Patient: Lucas Lin      Date of Birth: 07/18/1941 MRN: [id number] PCP: [name], [name], MD (Inactive)      Visit Date: 03/27/2024   Universal Protocol:    Date/Time: 03/27/2024  Consent Given By: the patient  Position: PRONE   Additional Comments: Vital signs were monitored before and after the procedure. Patient was prepped and draped in the usual sterile fashion. The correct patient, procedure, and site was verified.   Injection Procedure Details:  Procedure Site One Meds Administered:  Meds ordered this encounter  Medications   methylPREDNISolone  acetate (DEPO-MEDROL ) injection 40 mg     Laterality: Right  Location/Site:  L4-L5 L5-S1  Needle size: 22 guage  Needle type: Spinal  Needle Placement: Articular  Findings:  -Comments: Excellent flow of contrast producing a partial arthrogram.  Procedure Details: The fluoroscope beam is vertically oriented in AP, and the inferior recess is visualized beneath the lower pole of the inferior apophyseal process, which represents the target point for needle insertion. When direct visualization is difficult the target point is located at the medial projection of the vertebral pedicle. The region overlying each aforementioned target is locally anesthetized with a 1 to 2 ml. volume of 1% Lidocaine  without Epinephrine.   The spinal needle was inserted into each of the above mentioned facet joints using biplanar fluoroscopic guidance. A 0.25 to 0.5 ml. volume of Isovue-250 was injected and a partial facet joint arthrogram was obtained. A single spot film was obtained of the resulting arthrogram.    One to 1.25 ml of the steroid/anesthetic solution was then injected into each of the facet joints noted above.   Additional Comments:  The patient tolerated the procedure well Dressing: 2 x 2 sterile gauze and Band-Aid    Post-procedure  details: Patient was observed during the procedure. Post-procedure instructions were reviewed.  Patient left the clinic in stable condition.

## 2024-04-17 ENCOUNTER — Telehealth: Payer: Self-pay | Admitting: Physical Medicine and Rehabilitation

## 2024-04-17 NOTE — Telephone Encounter (Signed)
 Pt was told to call his his injection from [facility] did not help. Pt states he is still in pain and injection did not do anything. Pt is asking for a call back at [phone].

## 2024-04-18 ENCOUNTER — Telehealth: Payer: Self-pay | Admitting: Physical Medicine and Rehabilitation

## 2024-04-18 NOTE — Telephone Encounter (Signed)
 Patient called. Says the injection did not work. He is still having pain

## 2024-04-18 NOTE — Telephone Encounter (Signed)
 Patient called and said the shot didn't do anything. CB#[phone]

## 2024-04-19 ENCOUNTER — Telehealth: Payer: Self-pay | Admitting: Physical Medicine and Rehabilitation

## 2024-04-19 NOTE — Telephone Encounter (Signed)
 Patient called. He would like to be referred to a pain management.

## 2024-04-22 ENCOUNTER — Other Ambulatory Visit: Payer: Self-pay | Admitting: Physical Medicine and Rehabilitation

## 2024-04-22 DIAGNOSIS — G8929 Other chronic pain: Secondary | ICD-10-CM

## 2024-04-22 DIAGNOSIS — M47816 Spondylosis without myelopathy or radiculopathy, lumbar region: Secondary | ICD-10-CM

## 2024-06-18 ENCOUNTER — Emergency Department (HOSPITAL_COMMUNITY)

## 2024-06-18 ENCOUNTER — Emergency Department (HOSPITAL_COMMUNITY)
Admission: EM | Admit: 2024-06-18 | Discharge: 2024-06-18 | Disposition: A | Discharge status: Home / Self Care | Attending: Emergency Medicine | Admitting: Emergency Medicine

## 2024-06-18 ENCOUNTER — Encounter (HOSPITAL_COMMUNITY): Payer: Self-pay

## 2024-06-18 ENCOUNTER — Other Ambulatory Visit: Payer: Self-pay

## 2024-06-18 DIAGNOSIS — E876 Hypokalemia: Secondary | ICD-10-CM | POA: Diagnosis not present

## 2024-06-18 DIAGNOSIS — Z794 Long term (current) use of insulin: Secondary | ICD-10-CM | POA: Diagnosis not present

## 2024-06-18 DIAGNOSIS — D72829 Elevated white blood cell count, unspecified: Secondary | ICD-10-CM | POA: Diagnosis not present

## 2024-06-18 DIAGNOSIS — I10 Essential (primary) hypertension: Secondary | ICD-10-CM | POA: Insufficient documentation

## 2024-06-18 DIAGNOSIS — M7989 Other specified soft tissue disorders: Secondary | ICD-10-CM | POA: Diagnosis present

## 2024-06-18 DIAGNOSIS — M109 Gout, unspecified: Secondary | ICD-10-CM | POA: Diagnosis not present

## 2024-06-18 DIAGNOSIS — E119 Type 2 diabetes mellitus without complications: Secondary | ICD-10-CM | POA: Insufficient documentation

## 2024-06-18 DIAGNOSIS — Z79899 Other long term (current) drug therapy: Secondary | ICD-10-CM | POA: Diagnosis not present

## 2024-06-18 LAB — CBC WITH DIFFERENTIAL/PLATELET
Abs Immature Granulocytes: 0.05 K/uL (ref 0.00–0.07)
Basophils Absolute: 0.1 K/uL (ref 0.0–0.1)
Basophils Relative: 1 %
Eosinophils Absolute: 0.1 K/uL (ref 0.0–0.5)
Eosinophils Relative: 1 %
HCT: 45.6 % (ref 39.0–52.0)
Hemoglobin: 15 g/dL (ref 13.0–17.0)
Immature Granulocytes: 1 %
Lymphocytes Relative: 13 %
Lymphs Abs: 1.4 K/uL (ref 0.7–4.0)
MCH: 29.6 pg (ref 26.0–34.0)
MCHC: 32.9 g/dL (ref 30.0–36.0)
MCV: 89.9 fL (ref 80.0–100.0)
Monocytes Absolute: 1.1 K/uL — ABNORMAL HIGH (ref 0.1–1.0)
Monocytes Relative: 10 %
Neutro Abs: 8.1 K/uL — ABNORMAL HIGH (ref 1.7–7.7)
Neutrophils Relative %: 74 %
Platelets: 228 K/uL (ref 150–400)
RBC: 5.07 MIL/uL (ref 4.22–5.81)
RDW: 15.2 % (ref 11.5–15.5)
WBC: 10.8 K/uL — ABNORMAL HIGH (ref 4.0–10.5)
nRBC: 0 % (ref 0.0–0.2)

## 2024-06-18 LAB — CBG MONITORING, ED: Glucose-Capillary: 102 mg/dL — ABNORMAL HIGH (ref 70–99)

## 2024-06-18 LAB — SYNOVIAL CELL COUNT + DIFF, W/ CRYSTALS
Eosinophils-Synovial: 0 % (ref 0–1)
Lymphocytes-Synovial Fld: 1 % (ref 0–20)
Monocyte-Macrophage-Synovial Fluid: 21 % — ABNORMAL LOW (ref 50–90)
Neutrophil, Synovial: 78 % — ABNORMAL HIGH (ref 0–25)
WBC, Synovial: 7075 /mm3 — ABNORMAL HIGH (ref 0–200)

## 2024-06-18 LAB — BASIC METABOLIC PANEL WITH GFR
Anion gap: 18 — ABNORMAL HIGH (ref 5–15)
BUN: 35 mg/dL — ABNORMAL HIGH (ref 8–23)
CO2: 22 mmol/L (ref 22–32)
Calcium: 9.4 mg/dL (ref 8.9–10.3)
Chloride: 95 mmol/L — ABNORMAL LOW (ref 98–111)
Creatinine, Ser: 2.13 mg/dL — ABNORMAL HIGH (ref 0.61–1.24)
GFR, Estimated: 30 mL/min — ABNORMAL LOW (ref >60)
Glucose, Bld: 135 mg/dL — ABNORMAL HIGH (ref 70–99)
Potassium: 3.2 mmol/L — ABNORMAL LOW (ref 3.5–5.1)
Sodium: 135 mmol/L (ref 135–145)

## 2024-06-18 LAB — C-REACTIVE PROTEIN: CRP: 18 mg/dL — ABNORMAL HIGH (ref <1.0)

## 2024-06-18 LAB — SEDIMENTATION RATE: Sed Rate: 53 mm/h — ABNORMAL HIGH (ref 0–16)

## 2024-06-18 MED ORDER — LIDOCAINE-EPINEPHRINE (PF) 2 %-1:200000 IJ SOLN
10.0000 mL | Freq: Once | INTRAMUSCULAR | Status: DC
  Filled 2024-06-18: qty 20

## 2024-06-18 MED ORDER — PREDNISONE 10 MG (21) PO TBPK: ORAL_TABLET | Freq: Every day | ORAL | 0 refills | Status: AC

## 2024-06-18 MED ORDER — POTASSIUM CHLORIDE CRYS ER 20 MEQ PO TBCR
40.0000 meq | EXTENDED_RELEASE_TABLET | Freq: Once | ORAL | Status: AC
  Administered 2024-06-18: 40 meq via ORAL
  Filled 2024-06-18: qty 2

## 2024-06-18 MED ORDER — OXYCODONE-ACETAMINOPHEN 5-325 MG PO TABS: 1.0000 | ORAL_TABLET | Freq: Four times a day (QID) | ORAL | 0 refills | Status: AC | PRN

## 2024-06-18 MED ORDER — OXYCODONE-ACETAMINOPHEN 5-325 MG PO TABS
1.0000 | ORAL_TABLET | Freq: Once | ORAL | Status: AC
  Administered 2024-06-18: 1 via ORAL
  Filled 2024-06-18: qty 1

## 2024-06-18 MED ORDER — PREDNISONE 20 MG PO TABS
60.0000 mg | ORAL_TABLET | Freq: Once | ORAL | Status: AC
  Administered 2024-06-18: 60 mg via ORAL
  Filled 2024-06-18: qty 3

## 2024-06-18 NOTE — Discharge Instructions (Addendum)
 A prescription for a steroid taper was sent to your pharmacy.  Take as prescribed.  A prescription for medication called oxycodone  was sent to your pharmacy.  This is a strong narcotic pain medication.  Take this only as needed.  Follow-up with your PCP.  Return to the emergency department for any new or worsening symptoms of concern.

## 2024-06-18 NOTE — ED Provider Triage Note (Signed)
 Emergency Medicine Provider Triage Evaluation Note  Lucas Lin , a 83 y.o. male  was evaluated in triage.  Pt complains of hand pain. Pain and swelling to L hand ongoing x 4 days.  Unsure if there was any injury.  Does have hx of arthritis.  No hx of gout.  Pt is R hand dominant.  No fever, chills  Review of Systems  Positive: As above Negative: As above  Physical Exam  BP 134/77 (BP Location: Right Arm)   Pulse 81   Temp 98.2 F (36.8 C) (Oral)   Resp 18   SpO2 99%  Gen:   Awake, no distress   Resp:  Normal effort  MSK:   Moves extremities without difficulty  Other:  L hand edematous and ttp  Medical Decision Making  Medically screening exam initiated at 12:57 PM.  Appropriate orders placed.  Lucas Lin was informed that the remainder of the evaluation will be completed by another provider, this initial triage assessment does not replace that evaluation, and the importance of remaining in the ED until their evaluation is complete.     Nivia Colon, PA-C 06/18/24 1259

## 2024-06-18 NOTE — ED Provider Notes (Signed)
 [place] EMERGENCY DEPARTMENT AT [facility] Provider Note   CSN: [id number] Arrival date & time: 06/18/24  1230     Patient presents with: No chief complaint on file.   Lucas Lin is a 83 y.o. male.   HPI Patient presents for pain and swelling to left hand.  Medical history includes HTN, arthritis, DM, neuropathy, epilepsy.  Pain and swelling have been worsening over the past 4 days.  He denies any recent injury.  Area of maximal pain is medial aspect of left wrist.  When flexing and extending his fingers, pain in medial wrist is worsened.  He denies any systemic symptoms.    Prior to Admission medications   Medication Sig Start Date End Date Taking? Authorizing Provider  oxyCODONE -acetaminophen  (PERCOCET/ROXICET) 5-325 MG tablet Take 1 tablet by mouth every 6 (six) hours as needed for up to 5 days for severe pain (pain score 7-10). 06/18/24 06/23/24 Yes Melvenia Motto, MD  predniSONE  (STERAPRED UNI-PAK 21 TAB) 10 MG (21) TBPK tablet Take by mouth daily. Take 6 tabs by mouth daily  for 2 days, then 5 tabs for 2 days, then 4 tabs for 2 days, then 3 tabs for 2 days, 2 tabs for 2 days, then 1 tab by mouth daily for 2 days 06/18/24  Yes Melvenia Motto, MD  Accu-Chek Softclix Lancets lancets Use 3 (three) times daily. 12/01/22   Arlice Reichert, MD  amLODipine  (NORVASC ) 10 MG tablet Take 10 mg by mouth daily. 09/21/16   [provider]  Blood Glucose Monitoring Suppl (BLOOD GLUCOSE MONITOR SYSTEM) w/Device KIT Use 3 (three) times daily. 12/01/22   Dahal, Reichert, MD  Glucagon  (GVOKE HYPOPEN  2-PACK) 1 MG/0.2ML SOAJ Inject 1 mg into the skin as needed for up to 2 doses (Severe low blood sugar). 12/01/22   Arlice Reichert, MD  Glucose Blood (BLOOD GLUCOSE TEST STRIPS) STRP Use 3 (three) times daily as directed to check blood sugar. 12/01/22   Arlice Reichert, MD  insulin  glargine (LANTUS ) 100 UNIT/ML Solostar Pen Inject 25 Units into the skin daily. 12/01/22 03/01/23  Arlice Reichert, MD   insulin  lispro (HUMALOG ) 100 UNIT/ML KwikPen Inject 6 Units into the skin 3 (three) times daily with meals. Only take if eating a meal AND Blood Glucose (BG) is 80 or higher. 12/01/22 03/01/23  Arlice Reichert, MD  Insulin  Pen Needle 32G X 4 MM MISC Use 3 (three) times daily. 12/01/22   Arlice Reichert, MD  Lancet Device MISC 1 each by Does not apply route 3 (three) times daily. May dispense any manufacturer covered by patient's insurance. 12/01/22   Arlice Reichert, MD  levETIRAcetam  (KEPPRA ) 500 MG tablet Take 1 tablet (500 mg total) by mouth every 12 (twelve) hours. 12/07/22 12/02/23  Onita Duos, MD    Allergies: Doxazosin and Lisinopril    Review of Systems  Musculoskeletal:  Positive for arthralgias and joint swelling.  All other systems reviewed and are negative.   Updated Vital Signs BP (!) 149/80   Pulse 81   Temp (!) 97.4 F (36.3 C)   Resp 16   SpO2 95%   Physical Exam Vitals and nursing note reviewed.  Constitutional:      General: He is not in acute distress.    Appearance: Normal appearance. He is well-developed. He is not ill-appearing, toxic-appearing or diaphoretic.  HENT:     Head: Normocephalic and atraumatic.     Right Ear: External ear normal.     Left Ear: External ear normal.  Nose: Nose normal.     Mouth/Throat:     Mouth: Mucous membranes are moist.  Eyes:     Extraocular Movements: Extraocular movements intact.     Conjunctiva/sclera: Conjunctivae normal.  Cardiovascular:     Rate and Rhythm: Normal rate and regular rhythm.  Pulmonary:     Effort: Pulmonary effort is normal. No respiratory distress.  Abdominal:     General: There is no distension.     Palpations: Abdomen is soft.     Tenderness: There is no abdominal tenderness.  Musculoskeletal:        General: Swelling and tenderness present.     Cervical back: Normal range of motion and neck supple.  Skin:    General: Skin is warm and dry.  Neurological:     General: No focal deficit present.      Mental Status: He is alert and oriented to person, place, and time.     Cranial Nerves: No cranial nerve deficit.     Sensory: No sensory deficit.     Motor: No weakness.     Coordination: Coordination normal.  Psychiatric:        Mood and Affect: Mood normal.        Behavior: Behavior normal.     (all labs ordered are listed, but only abnormal results are displayed) Labs Reviewed  CBC WITH DIFFERENTIAL/PLATELET - Abnormal; Notable for the following components:      Result Value   WBC 10.8 (*)    Neutro Abs 8.1 (*)    Monocytes Absolute 1.1 (*)    All other components within normal limits  BASIC METABOLIC PANEL WITH GFR - Abnormal; Notable for the following components:   Potassium 3.2 (*)    Chloride 95 (*)    Glucose, Bld 135 (*)    BUN 35 (*)    Creatinine, Ser 2.13 (*)    GFR, Estimated 30 (*)    Anion gap 18 (*)    All other components within normal limits  SEDIMENTATION RATE - Abnormal; Notable for the following components:   Sed Rate 53 (*)    All other components within normal limits  C-REACTIVE PROTEIN - Abnormal; Notable for the following components:   CRP 18.0 (*)    All other components within normal limits  SYNOVIAL CELL COUNT + DIFF, W/ CRYSTALS - Abnormal; Notable for the following components:   Color, Synovial STRAW (*)    Appearance-Synovial TURBID (*)    WBC, Synovial 7,075 (*)    Neutrophil, Synovial 78 (*)    Monocyte-Macrophage-Synovial Fluid 21 (*)    All other components within normal limits  CBG MONITORING, ED - Abnormal; Notable for the following components:   Glucose-Capillary 102 (*)    All other components within normal limits  BODY FLUID CULTURE W GRAM STAIN  GLUCOSE, BODY FLUID OTHER            PATHOLOGIST SMEAR REVIEW    EKG: None  Radiology: DG Hand Complete Left Result Date: 06/18/2024 EXAM: 3 OR MORE VIEW(S) XRAY OF THE LEFT HAND 06/18/2024 01:29:12 PM COMPARISON: None available. CLINICAL HISTORY: swelling FINDINGS: BONES AND  JOINTS: Severe osteoarthritis of the first [facility] joint with subchondral cysts and bulky marginal osteophyte formation. Severe osteoarthritis of the middle and small finger PIP joints with prominent osteophytosis and articular surface irregularity with findings suggestive of erosive change. There is additional osteoarthritis of the ring finger PIP joint. Osteoarthritis of the 1st and 2nd MCP joints. Possible erosions of the 2nd  metacarpal head. Chronic subluxation at the 2nd and 3rd MCP joints. No acute fracture. SOFT TISSUES: Diffuse soft tissue swelling of the wrist. IMPRESSION: 1. Severe osteoarthritis of the first [facility] joint. 2. Severe osteoarthritis of the middle and small finger PIP joints with erosive changes. 3. Additional osteoarthritis of the ring finger PIP joint and 1st/2nd MCP joints with possible erosions of the 2nd metacarpal head. 4. Chronic subluxation at the second and third MCP joints. 5. Diffuse soft tissue swelling of the wrist. Electronically signed by: Donnice Mania MD 06/18/2024 02:58 PM EST RP Workstation: [id number]     .Joint Aspiration/Arthrocentesis  Date/Time: 06/18/2024 8:37 PM  Performed by: Melvenia Motto, MD Authorized by: Melvenia Motto, MD   Consent:    Consent obtained:  Verbal   Consent given by:  Patient   Risks, benefits, and alternatives were discussed: yes     Risks discussed:  Bleeding, infection, incomplete drainage and pain   Alternatives discussed:  No treatment Universal protocol:    Procedure explained and questions answered to patient or proxy's satisfaction: yes     Patient identity confirmed:  Verbally with patient Location:    Location:  Wrist   Wrist:  L radiocarpal Anesthesia:    Anesthesia method:  Local infiltration   Local anesthetic:  Lidocaine  2% WITH epi Procedure details:    Preparation: Patient was prepped and draped in usual sterile fashion     Needle gauge:  22 G   Approach:  Posterior   Aspirate amount:  2cc   Aspirate  characteristics:  Cloudy   Steroid injected: no     Specimen collected: yes   Post-procedure details:    Dressing:  Adhesive bandage   Procedure completion:  Tolerated well, no immediate complications    Medications Ordered in the ED  lidocaine -EPINEPHrine  (XYLOCAINE  W/EPI) 2 %-1:200000 (PF) injection 10 mL (10 mLs Intradermal See Procedure Record 06/18/24 2017)  potassium chloride  SA (KLOR-CON  M) CR tablet 40 mEq (has no administration in time range)  oxyCODONE -acetaminophen  (PERCOCET/ROXICET) 5-325 MG per tablet 1 tablet (1 tablet Oral Given 06/18/24 1940)  predniSONE  (DELTASONE ) tablet 60 mg (60 mg Oral Given 06/18/24 2318)                                    Medical Decision Making Amount and/or Complexity of Data Reviewed Labs: ordered.  Risk Prescription drug management.   This patient presents to the ED for concern of pain and swelling to left wrist and hand, this involves an extensive number of treatment options, and is a complaint that carries with it a high risk of complications and morbidity.  The differential diagnosis includes inflammatory arthritis, gout, pseudogout, septic arthritis   Co morbidities / Chronic conditions that complicate the patient evaluation  HTN, arthritis, DM, neuropathy, epilepsy   Additional history obtained:  Additional history obtained from EMR External records from outside source obtained and reviewed including patient's family member   Lab Tests:  I Ordered, and personally interpreted labs.  The pertinent results include: Mild elevation in WBC and inflammatory markers.  Creatinine slightly increased from 1 year ago, mild hypokalemia   Imaging Studies ordered:  I ordered imaging studies including x-ray of left hand I independently visualized and interpreted imaging which showed severe osteoarthritis, soft tissue swelling of wrist I agree with the radiologist interpretation   Cardiac Monitoring: / EKG:  The patient was  maintained on a cardiac monitor.  I  personally viewed and interpreted the cardiac monitored which showed an underlying rhythm of: Sinus rhythm   Problem List / ED Course / Critical interventions / Medication management  Patient presenting for atraumatic pain and swelling to left hand and wrist.  Onset was 4 days ago.  Symptoms have been worsening.  On arrival in the ED, vital signs are normal.  Patient is overall well-appearing on exam.  He does have significant swelling to left hand and wrist.  Area of maximal pain and tenderness is to the medial aspect of his left wrist.  Joint is warm to the touch.  There is no significant overlying skin change.  Percocet was ordered for analgesia.  Lab work, obtained prior to patient being bedded in the ED is notable for mild leukocytosis and mild elevation in inflammatory markers.  His creatinine has mildly increased from 1 year ago.  Mild hypokalemia is present.  Given patient's pain and swelling in wrist, there is concern of possible septic arthritis.  Patient is agreeable to arthrocentesis.  This was performed at bedside.  Results of synovial fluid analysis show gout and pseudogout.  Given patient's CKD, patient to be treated with steroids.  Initial dose given in the ED.  He was prescribed tapering dose for home.  He was discharged in stable condition. I ordered medication including Percocet for analgesia, prednisone  for gout, potassium chloride  for hypokalemia Reevaluation of the patient after these medicines showed that the patient improved I have reviewed the patients home medicines and have made adjustments as needed  Social Determinants of Health:  Has PCP      Final diagnoses:  Acute gout of left wrist, unspecified cause    ED Discharge Orders          Ordered    predniSONE  (STERAPRED UNI-PAK 21 TAB) 10 MG (21) TBPK tablet  Daily        06/18/24 2322    oxyCODONE -acetaminophen  (PERCOCET/ROXICET) 5-325 MG tablet  Every 6 hours PRN         06/18/24 2322               Melvenia Motto, MD 06/18/24 2325

## 2024-06-18 NOTE — ED Triage Notes (Signed)
 Pt c/o L hand pain x 4 days ago, worse with movement  Pt gives verbal consent for mse

## 2024-06-21 LAB — BODY FLUID CULTURE W GRAM STAIN: Culture: NO GROWTH

## 2024-06-21 LAB — GLUCOSE, BODY FLUID OTHER: Glucose, Body Fluid Other: 44 mg/dL
